# Patient Record
Sex: Male | Born: 1960 | Race: White | Hispanic: No | Marital: Married | Smoking: Former smoker
Health system: Southern US, Community
[De-identification: ages and names within clinical notes are randomized; demographics above are authoritative.]

## PROBLEM LIST (undated history)

## (undated) DIAGNOSIS — M5020 Other cervical disc displacement, unspecified cervical region: Secondary | ICD-10-CM

## (undated) DIAGNOSIS — F32A Depression, unspecified: Secondary | ICD-10-CM

## (undated) DIAGNOSIS — F329 Major depressive disorder, single episode, unspecified: Secondary | ICD-10-CM

## (undated) DIAGNOSIS — R413 Other amnesia: Secondary | ICD-10-CM

## (undated) DIAGNOSIS — H539 Unspecified visual disturbance: Secondary | ICD-10-CM

## (undated) DIAGNOSIS — C8190 Hodgkin lymphoma, unspecified, unspecified site: Secondary | ICD-10-CM

## (undated) DIAGNOSIS — F419 Anxiety disorder, unspecified: Secondary | ICD-10-CM

## (undated) HISTORY — PX: HERNIA REPAIR: SHX51

---

## 2015-05-11 DIAGNOSIS — C8100 Nodular lymphocyte predominant Hodgkin lymphoma, unspecified site: Secondary | ICD-10-CM | POA: Insufficient documentation

## 2015-07-16 ENCOUNTER — Ambulatory Visit: Payer: Self-pay | Admitting: Radiation Oncology

## 2015-07-20 ENCOUNTER — Encounter: Payer: Self-pay | Admitting: Radiation Oncology

## 2015-07-20 ENCOUNTER — Encounter (INDEPENDENT_AMBULATORY_CARE_PROVIDER_SITE_OTHER): Payer: Self-pay

## 2015-07-20 ENCOUNTER — Ambulatory Visit
Admission: RE | Admit: 2015-07-20 | Discharge: 2015-07-20 | Disposition: A | Discharge status: Home / Self Care | Payer: Medicare Other | Source: Ambulatory Visit | Attending: Radiation Oncology | Admitting: Radiation Oncology

## 2015-07-20 VITALS — BP 135/94 | HR 79 | Temp 96.9°F | Ht 66.0 in | Wt 230.6 lb

## 2015-07-20 DIAGNOSIS — Z510 Encounter for antineoplastic radiation therapy: Secondary | ICD-10-CM | POA: Insufficient documentation

## 2015-07-20 DIAGNOSIS — C8190 Hodgkin lymphoma, unspecified, unspecified site: Secondary | ICD-10-CM | POA: Insufficient documentation

## 2015-07-20 HISTORY — DX: Unspecified visual disturbance: H53.9

## 2015-07-20 HISTORY — DX: Other amnesia: R41.3

## 2015-07-20 HISTORY — DX: Major depressive disorder, single episode, unspecified: F32.9

## 2015-07-20 HISTORY — DX: Hodgkin lymphoma, unspecified, unspecified site: C81.90

## 2015-07-20 HISTORY — DX: Other cervical disc displacement, unspecified cervical region: M50.20

## 2015-07-20 HISTORY — DX: Anxiety disorder, unspecified: F41.9

## 2015-07-20 HISTORY — DX: Depression, unspecified: F32.A

## 2015-07-20 NOTE — Consult Note (Signed)
Except an outstanding is perfect of Radiation Oncology NEW PATIENT EVALUATION  Name: Alexander Humphrey  MRN: [id number]  Date:   07/20/2015     DOB: 1961/10/12   This 54 y.o. male patient presents to the clinic for initial evaluation of nodular lymphocyte predominant Hodgkin's lymphoma  REFERRING PHYSICIAN: No ref. provider found  CHIEF COMPLAINT:  Chief Complaint  Patient presents with  . Lymphoma    initial consult     DIAGNOSIS: The encounter diagnosis was Hodgkin lymphoma.   PREVIOUS INVESTIGATIONS:  PET CT scan and CT scans reviewed Surgical pathology report reviewed Clinical notes reviewed  HPI: Patient is a 54 year old male who was originally admitted in [place] [facility] [place] for presumed flu and pneumonia. He extensive peripheral interstitial infiltrates and incidentally noted were bulky left axillary lymph nodes the largest being 2.6 cm. Biopsy of his left axillary lymph node was seen and reviewed at the [facility] consistent with nodular lymphocyte predominant Hodgkin's lymphoma. PET CT scan demonstrated a hypermetabolic 1.3 cm left supraclavicular lymph node. Also noted were multiple small lymph nodes in the lower cervical chain on the left supraclavicular fossa and left axilla. Patient has no B type symptoms. His primary symptoms have improved. He is living here with his sister now in [place]. Recommendation at prior hospital was for radiation therapy involved field. Patient has declined bone marrow biopsy for complete staging. He has had a port placed in his right anterior chest wall.  PLANNED TREATMENT REGIMEN: Involved field radiation therapy  PAST MEDICAL HISTORY:  has a past medical history of Hodgkin's lymphoma; Memory changes; Changes in vision; Herniated disc, cervical; Anxiety; and Depression.    PAST SURGICAL HISTORY:  Past Surgical History  Procedure Laterality Date  . Hernia repair      FAMILY HISTORY: family history is not  on file.  SOCIAL HISTORY:  reports that he quit smoking about 34 years ago. He has never used smokeless tobacco. He reports that he does not drink alcohol or use illicit drugs.  ALLERGIES: Percocet  MEDICATIONS:  Current Outpatient Prescriptions  Medication Sig Dispense Refill  . carbamazepine (TEGRETOL) 200 MG tablet Take 400 mg by mouth 4 (four) times daily.    Marland Kitchen FLUoxetine (PROZAC) 40 MG capsule Take 40 mg by mouth 2 (two) times daily.     No current facility-administered medications for this encounter.    ECOG PERFORMANCE STATUS:  0 - Asymptomatic  REVIEW OF SYSTEMS:  Patient denies any weight loss, fatigue, weakness, fever, chills or night sweats. Patient denies any loss of vision, blurred vision. Patient denies any ringing  of the ears or hearing loss. No irregular heartbeat. Patient denies heart murmur or history of fainting. Patient denies any chest pain or pain radiating to her upper extremities. Patient denies any shortness of breath, difficulty breathing at night, cough or hemoptysis. Patient denies any swelling in the lower legs. Patient denies any nausea vomiting, vomiting of blood, or coffee ground material in the vomitus. Patient denies any stomach pain. Patient states has had normal bowel movements no significant constipation or diarrhea. Patient denies any dysuria, hematuria or significant nocturia. Patient denies any problems walking, swelling in the joints or loss of balance. Patient denies any skin changes, loss of hair or loss of weight. Patient denies any excessive worrying or anxiety or significant depression. Patient denies any problems with insomnia. Patient denies excessive thirst, polyuria, polydipsia. Patient denies any swollen glands, patient denies easy bruising or easy bleeding. Patient denies any recent  infections, allergies or URI. Patient "s visual fields have not changed significantly in recent time.    PHYSICAL EXAM: BP 135/94 mmHg  Pulse 79  Temp(Src) 96.9  F (36.1 C)  Ht 5' 6"  (1.676 m)  Wt 230 lb 9.6 oz (104.6 kg)  BMI 37.24 kg/m2 Patient is a poor Place his rent right anterior chest wall. No palpable adenopathy in his sub-digastric cervical supraclavicular or axillary regions are noted. Well-developed well-nourished patient in NAD. HEENT reveals PERLA, EOMI, discs not visualized.  Oral cavity is clear. No oral mucosal lesions are identified. Neck is clear without evidence of cervical or supraclavicular adenopathy. Lungs are clear to A&P. Cardiac examination is essentially unremarkable with regular rate and rhythm without murmur rub or thrill. Abdomen is benign with no organomegaly or masses noted. Motor sensory and DTR levels are equal and symmetric in the upper and lower extremities. Cranial nerves II through XII are grossly intact. Proprioception is intact. No peripheral adenopathy or edema is identified. No motor or sensory levels are noted. Crude visual fields are within normal range.   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 Based on his pulmonary interstitial infiltrate will try to avoid is much as long as possible and treat up to 3600 cGy to his neck left supraclavicular fossa and left axilla. I will present his case at our weekly tumor conference and I've also asked for consultation for medical oncology for their opinion on the case. Most data shows nodular lymphocyte predominant Hodgkin's lymphoma best treated with radiation therapy alone and reserving chemotherapy for salvage. Risks and benefits of treatment including skin reaction fatigue, some inclusion of normal lung alteration of blood counts and slight possibility of swelling of his left upper extremity all were discussed in detail  with the patient. He seems to comprehend my treatment plan well.  I would like to take this opportunity for allowing me to participate in the care of your patient.Armstead Peaks., MD

## 2015-07-23 ENCOUNTER — Inpatient Hospital Stay: Payer: Medicare Other

## 2015-07-23 ENCOUNTER — Ambulatory Visit: Payer: Medicare Other

## 2015-07-28 ENCOUNTER — Inpatient Hospital Stay: Payer: Medicare Other | Attending: Radiation Oncology

## 2015-07-28 ENCOUNTER — Ambulatory Visit
Admission: RE | Admit: 2015-07-28 | Discharge: 2015-07-28 | Disposition: A | Discharge status: Home / Self Care | Payer: Medicare Other | Source: Ambulatory Visit | Attending: Radiation Oncology | Admitting: Radiation Oncology

## 2015-07-28 DIAGNOSIS — C8190 Hodgkin lymphoma, unspecified, unspecified site: Secondary | ICD-10-CM | POA: Insufficient documentation

## 2015-07-28 DIAGNOSIS — Z452 Encounter for adjustment and management of vascular access device: Secondary | ICD-10-CM | POA: Insufficient documentation

## 2015-07-28 DIAGNOSIS — C801 Malignant (primary) neoplasm, unspecified: Secondary | ICD-10-CM

## 2015-07-28 DIAGNOSIS — Z510 Encounter for antineoplastic radiation therapy: Secondary | ICD-10-CM | POA: Diagnosis not present

## 2015-07-28 MED ORDER — SODIUM CHLORIDE 0.9 % IJ SOLN
10.0000 mL | INTRAMUSCULAR | Status: DC | PRN
  Administered 2015-07-28: 10 mL via INTRAVENOUS
  Filled 2015-07-28: qty 10

## 2015-07-28 MED ORDER — HEPARIN SOD (PORK) LOCK FLUSH 100 UNIT/ML IV SOLN
500.0000 [IU] | Freq: Once | INTRAVENOUS | Status: AC
  Administered 2015-07-28: 500 [IU] via INTRAVENOUS
  Filled 2015-07-28: qty 5

## 2015-07-30 DIAGNOSIS — Z510 Encounter for antineoplastic radiation therapy: Secondary | ICD-10-CM | POA: Diagnosis not present

## 2015-07-31 ENCOUNTER — Other Ambulatory Visit: Payer: Self-pay | Admitting: *Deleted

## 2015-07-31 DIAGNOSIS — C8100 Nodular lymphocyte predominant Hodgkin lymphoma, unspecified site: Secondary | ICD-10-CM

## 2015-08-04 ENCOUNTER — Ambulatory Visit
Admission: RE | Admit: 2015-08-04 | Discharge: 2015-08-04 | Disposition: A | Discharge status: Home / Self Care | Payer: Medicare Other | Source: Ambulatory Visit | Attending: Radiation Oncology | Admitting: Radiation Oncology

## 2015-08-04 DIAGNOSIS — Z510 Encounter for antineoplastic radiation therapy: Secondary | ICD-10-CM | POA: Diagnosis not present

## 2015-08-05 ENCOUNTER — Ambulatory Visit
Admission: RE | Admit: 2015-08-05 | Discharge: 2015-08-05 | Disposition: A | Discharge status: Home / Self Care | Payer: Medicare Other | Source: Ambulatory Visit | Attending: Radiation Oncology | Admitting: Radiation Oncology

## 2015-08-05 DIAGNOSIS — Z510 Encounter for antineoplastic radiation therapy: Secondary | ICD-10-CM | POA: Diagnosis not present

## 2015-08-06 ENCOUNTER — Ambulatory Visit
Admission: RE | Admit: 2015-08-06 | Discharge: 2015-08-06 | Disposition: A | Discharge status: Home / Self Care | Payer: Medicare Other | Source: Ambulatory Visit | Attending: Radiation Oncology | Admitting: Radiation Oncology

## 2015-08-06 ENCOUNTER — Ambulatory Visit: Payer: Medicare Other

## 2015-08-06 DIAGNOSIS — Z510 Encounter for antineoplastic radiation therapy: Secondary | ICD-10-CM | POA: Diagnosis not present

## 2015-08-07 ENCOUNTER — Ambulatory Visit
Admission: RE | Admit: 2015-08-07 | Discharge: 2015-08-07 | Disposition: A | Discharge status: Home / Self Care | Payer: Medicare Other | Source: Ambulatory Visit | Attending: Radiation Oncology | Admitting: Radiation Oncology

## 2015-08-07 DIAGNOSIS — Z510 Encounter for antineoplastic radiation therapy: Secondary | ICD-10-CM | POA: Diagnosis not present

## 2015-08-10 ENCOUNTER — Ambulatory Visit
Admission: RE | Admit: 2015-08-10 | Discharge: 2015-08-10 | Disposition: A | Discharge status: Home / Self Care | Payer: Medicare Other | Source: Ambulatory Visit | Attending: Radiation Oncology | Admitting: Radiation Oncology

## 2015-08-10 ENCOUNTER — Ambulatory Visit: Payer: Medicare Other

## 2015-08-10 DIAGNOSIS — Z510 Encounter for antineoplastic radiation therapy: Secondary | ICD-10-CM | POA: Diagnosis not present

## 2015-08-11 ENCOUNTER — Ambulatory Visit: Payer: Medicare Other

## 2015-08-11 ENCOUNTER — Ambulatory Visit
Admission: RE | Admit: 2015-08-11 | Discharge: 2015-08-11 | Disposition: A | Discharge status: Home / Self Care | Payer: Medicare Other | Source: Ambulatory Visit | Attending: Radiation Oncology | Admitting: Radiation Oncology

## 2015-08-11 DIAGNOSIS — Z510 Encounter for antineoplastic radiation therapy: Secondary | ICD-10-CM | POA: Diagnosis not present

## 2015-08-12 ENCOUNTER — Ambulatory Visit
Admission: RE | Admit: 2015-08-12 | Discharge: 2015-08-12 | Disposition: A | Discharge status: Home / Self Care | Payer: Medicare Other | Source: Ambulatory Visit | Attending: Radiation Oncology | Admitting: Radiation Oncology

## 2015-08-12 ENCOUNTER — Ambulatory Visit: Payer: Medicare Other

## 2015-08-12 ENCOUNTER — Inpatient Hospital Stay: Payer: Medicare Other

## 2015-08-12 DIAGNOSIS — C8190 Hodgkin lymphoma, unspecified, unspecified site: Secondary | ICD-10-CM | POA: Diagnosis not present

## 2015-08-12 DIAGNOSIS — Z510 Encounter for antineoplastic radiation therapy: Secondary | ICD-10-CM | POA: Diagnosis not present

## 2015-08-12 DIAGNOSIS — C8100 Nodular lymphocyte predominant Hodgkin lymphoma, unspecified site: Secondary | ICD-10-CM

## 2015-08-12 LAB — CBC
HCT: 41.7 % (ref 40.0–52.0)
HEMOGLOBIN: 14.2 g/dL (ref 13.0–18.0)
MCH: 30.5 pg (ref 26.0–34.0)
MCHC: 34 g/dL (ref 32.0–36.0)
MCV: 89.8 fL (ref 80.0–100.0)
Platelets: 247 10*3/uL (ref 150–440)
RBC: 4.64 MIL/uL (ref 4.40–5.90)
RDW: 13.3 % (ref 11.5–14.5)
WBC: 2.9 10*3/uL — ABNORMAL LOW (ref 3.8–10.6)

## 2015-08-13 ENCOUNTER — Ambulatory Visit
Admission: RE | Admit: 2015-08-13 | Discharge: 2015-08-13 | Disposition: A | Discharge status: Home / Self Care | Payer: Medicare Other | Source: Ambulatory Visit | Attending: Radiation Oncology | Admitting: Radiation Oncology

## 2015-08-13 ENCOUNTER — Ambulatory Visit: Payer: Medicare Other

## 2015-08-13 DIAGNOSIS — Z510 Encounter for antineoplastic radiation therapy: Secondary | ICD-10-CM | POA: Diagnosis not present

## 2015-08-14 ENCOUNTER — Ambulatory Visit
Admission: RE | Admit: 2015-08-14 | Discharge: 2015-08-14 | Disposition: A | Discharge status: Home / Self Care | Payer: Medicare Other | Source: Ambulatory Visit | Attending: Radiation Oncology | Admitting: Radiation Oncology

## 2015-08-14 ENCOUNTER — Ambulatory Visit: Payer: Medicare Other

## 2015-08-14 DIAGNOSIS — Z510 Encounter for antineoplastic radiation therapy: Secondary | ICD-10-CM | POA: Diagnosis not present

## 2015-08-17 ENCOUNTER — Ambulatory Visit
Admission: RE | Admit: 2015-08-17 | Discharge: 2015-08-17 | Disposition: A | Discharge status: Home / Self Care | Payer: Medicare Other | Source: Ambulatory Visit | Attending: Radiation Oncology | Admitting: Radiation Oncology

## 2015-08-17 ENCOUNTER — Ambulatory Visit: Payer: Medicare Other

## 2015-08-17 DIAGNOSIS — Z510 Encounter for antineoplastic radiation therapy: Secondary | ICD-10-CM | POA: Diagnosis not present

## 2015-08-18 ENCOUNTER — Ambulatory Visit
Admission: RE | Admit: 2015-08-18 | Discharge: 2015-08-18 | Disposition: A | Discharge status: Home / Self Care | Payer: Medicare Other | Source: Ambulatory Visit | Attending: Radiation Oncology | Admitting: Radiation Oncology

## 2015-08-18 ENCOUNTER — Ambulatory Visit: Payer: Medicare Other

## 2015-08-18 DIAGNOSIS — Z510 Encounter for antineoplastic radiation therapy: Secondary | ICD-10-CM | POA: Diagnosis not present

## 2015-08-19 ENCOUNTER — Inpatient Hospital Stay: Payer: Medicare Other

## 2015-08-19 ENCOUNTER — Ambulatory Visit
Admission: RE | Admit: 2015-08-19 | Discharge: 2015-08-19 | Disposition: A | Discharge status: Home / Self Care | Payer: Medicare Other | Source: Ambulatory Visit | Attending: Radiation Oncology | Admitting: Radiation Oncology

## 2015-08-19 ENCOUNTER — Ambulatory Visit: Payer: Medicare Other

## 2015-08-19 DIAGNOSIS — C8190 Hodgkin lymphoma, unspecified, unspecified site: Secondary | ICD-10-CM | POA: Diagnosis not present

## 2015-08-19 DIAGNOSIS — Z510 Encounter for antineoplastic radiation therapy: Secondary | ICD-10-CM | POA: Diagnosis not present

## 2015-08-19 DIAGNOSIS — C8100 Nodular lymphocyte predominant Hodgkin lymphoma, unspecified site: Secondary | ICD-10-CM

## 2015-08-19 LAB — CBC
HCT: 39 % — ABNORMAL LOW (ref 40.0–52.0)
Hemoglobin: 13.6 g/dL (ref 13.0–18.0)
MCH: 31.2 pg (ref 26.0–34.0)
MCHC: 34.8 g/dL (ref 32.0–36.0)
MCV: 89.7 fL (ref 80.0–100.0)
PLATELETS: 204 10*3/uL (ref 150–440)
RBC: 4.35 MIL/uL — ABNORMAL LOW (ref 4.40–5.90)
RDW: 13 % (ref 11.5–14.5)
WBC: 2.9 10*3/uL — ABNORMAL LOW (ref 3.8–10.6)

## 2015-08-20 ENCOUNTER — Ambulatory Visit: Payer: Medicare Other

## 2015-08-20 ENCOUNTER — Ambulatory Visit
Admission: RE | Admit: 2015-08-20 | Discharge: 2015-08-20 | Disposition: A | Discharge status: Home / Self Care | Payer: Medicare Other | Source: Ambulatory Visit | Attending: Radiation Oncology | Admitting: Radiation Oncology

## 2015-08-20 DIAGNOSIS — Z510 Encounter for antineoplastic radiation therapy: Secondary | ICD-10-CM | POA: Diagnosis not present

## 2015-08-21 ENCOUNTER — Ambulatory Visit
Admission: RE | Admit: 2015-08-21 | Discharge: 2015-08-21 | Disposition: A | Discharge status: Home / Self Care | Payer: Medicare Other | Source: Ambulatory Visit | Attending: Radiation Oncology | Admitting: Radiation Oncology

## 2015-08-21 ENCOUNTER — Ambulatory Visit: Payer: Medicare Other

## 2015-08-21 DIAGNOSIS — Z510 Encounter for antineoplastic radiation therapy: Secondary | ICD-10-CM | POA: Diagnosis not present

## 2015-08-23 ENCOUNTER — Ambulatory Visit
Admission: RE | Admit: 2015-08-23 | Discharge: 2015-08-23 | Disposition: A | Discharge status: Home / Self Care | Payer: Medicare Other | Source: Ambulatory Visit | Attending: Radiation Oncology | Admitting: Radiation Oncology

## 2015-08-24 ENCOUNTER — Ambulatory Visit
Admission: RE | Admit: 2015-08-24 | Discharge: 2015-08-24 | Disposition: A | Discharge status: Home / Self Care | Payer: Medicare Other | Source: Ambulatory Visit | Attending: Radiation Oncology | Admitting: Radiation Oncology

## 2015-08-24 ENCOUNTER — Ambulatory Visit: Payer: Medicare Other

## 2015-08-24 DIAGNOSIS — Z510 Encounter for antineoplastic radiation therapy: Secondary | ICD-10-CM | POA: Diagnosis not present

## 2015-08-25 ENCOUNTER — Ambulatory Visit
Admission: RE | Admit: 2015-08-25 | Discharge: 2015-08-25 | Disposition: A | Discharge status: Home / Self Care | Payer: Medicare Other | Source: Ambulatory Visit | Attending: Radiation Oncology | Admitting: Radiation Oncology

## 2015-08-25 ENCOUNTER — Ambulatory Visit: Payer: Medicare Other

## 2015-08-25 DIAGNOSIS — Z510 Encounter for antineoplastic radiation therapy: Secondary | ICD-10-CM | POA: Diagnosis not present

## 2015-08-26 ENCOUNTER — Inpatient Hospital Stay: Payer: Medicare Other | Attending: Radiation Oncology

## 2015-08-26 ENCOUNTER — Ambulatory Visit
Admission: RE | Admit: 2015-08-26 | Discharge: 2015-08-26 | Disposition: A | Discharge status: Home / Self Care | Payer: Medicare Other | Source: Ambulatory Visit | Attending: Radiation Oncology | Admitting: Radiation Oncology

## 2015-08-26 ENCOUNTER — Ambulatory Visit: Payer: Medicare Other

## 2015-08-26 DIAGNOSIS — R413 Other amnesia: Secondary | ICD-10-CM | POA: Insufficient documentation

## 2015-08-26 DIAGNOSIS — F419 Anxiety disorder, unspecified: Secondary | ICD-10-CM | POA: Insufficient documentation

## 2015-08-26 DIAGNOSIS — Z87891 Personal history of nicotine dependence: Secondary | ICD-10-CM | POA: Insufficient documentation

## 2015-08-26 DIAGNOSIS — F329 Major depressive disorder, single episode, unspecified: Secondary | ICD-10-CM | POA: Insufficient documentation

## 2015-08-26 DIAGNOSIS — Z923 Personal history of irradiation: Secondary | ICD-10-CM | POA: Insufficient documentation

## 2015-08-26 DIAGNOSIS — M5020 Other cervical disc displacement, unspecified cervical region: Secondary | ICD-10-CM | POA: Diagnosis not present

## 2015-08-26 DIAGNOSIS — C8100 Nodular lymphocyte predominant Hodgkin lymphoma, unspecified site: Secondary | ICD-10-CM

## 2015-08-26 DIAGNOSIS — Z452 Encounter for adjustment and management of vascular access device: Secondary | ICD-10-CM | POA: Insufficient documentation

## 2015-08-26 DIAGNOSIS — C8190 Hodgkin lymphoma, unspecified, unspecified site: Secondary | ICD-10-CM | POA: Insufficient documentation

## 2015-08-26 DIAGNOSIS — Z510 Encounter for antineoplastic radiation therapy: Secondary | ICD-10-CM | POA: Diagnosis not present

## 2015-08-26 LAB — CBC
HEMATOCRIT: 39 % — AB (ref 40.0–52.0)
Hemoglobin: 13.3 g/dL (ref 13.0–18.0)
MCH: 30.7 pg (ref 26.0–34.0)
MCHC: 34.2 g/dL (ref 32.0–36.0)
MCV: 89.8 fL (ref 80.0–100.0)
PLATELETS: 193 10*3/uL (ref 150–440)
RBC: 4.35 MIL/uL — ABNORMAL LOW (ref 4.40–5.90)
RDW: 13.3 % (ref 11.5–14.5)
WBC: 4.2 10*3/uL (ref 3.8–10.6)

## 2015-08-27 ENCOUNTER — Ambulatory Visit
Admission: RE | Admit: 2015-08-27 | Discharge: 2015-08-27 | Disposition: A | Discharge status: Home / Self Care | Payer: Medicare Other | Source: Ambulatory Visit | Attending: Radiation Oncology | Admitting: Radiation Oncology

## 2015-08-27 ENCOUNTER — Ambulatory Visit: Admission: RE | Admit: 2015-08-27 | Payer: Medicare Other | Source: Ambulatory Visit

## 2015-08-27 ENCOUNTER — Ambulatory Visit: Payer: Medicare Other

## 2015-08-27 DIAGNOSIS — Z510 Encounter for antineoplastic radiation therapy: Secondary | ICD-10-CM | POA: Diagnosis not present

## 2015-08-28 ENCOUNTER — Ambulatory Visit
Admission: RE | Admit: 2015-08-28 | Discharge: 2015-08-28 | Disposition: A | Discharge status: Home / Self Care | Payer: Medicare Other | Source: Ambulatory Visit | Attending: Radiation Oncology | Admitting: Radiation Oncology

## 2015-08-28 ENCOUNTER — Ambulatory Visit: Payer: Medicare Other

## 2015-08-28 DIAGNOSIS — Z510 Encounter for antineoplastic radiation therapy: Secondary | ICD-10-CM | POA: Diagnosis not present

## 2015-08-31 ENCOUNTER — Ambulatory Visit
Admission: RE | Admit: 2015-08-31 | Discharge: 2015-08-31 | Disposition: A | Discharge status: Home / Self Care | Payer: Medicare Other | Source: Ambulatory Visit | Attending: Radiation Oncology | Admitting: Radiation Oncology

## 2015-08-31 DIAGNOSIS — Z510 Encounter for antineoplastic radiation therapy: Secondary | ICD-10-CM | POA: Diagnosis not present

## 2015-09-01 ENCOUNTER — Other Ambulatory Visit: Payer: Self-pay | Admitting: *Deleted

## 2015-09-01 ENCOUNTER — Ambulatory Visit
Admission: RE | Admit: 2015-09-01 | Discharge: 2015-09-01 | Disposition: A | Discharge status: Home / Self Care | Payer: Medicare Other | Source: Ambulatory Visit | Attending: Radiation Oncology | Admitting: Radiation Oncology

## 2015-09-01 DIAGNOSIS — Z510 Encounter for antineoplastic radiation therapy: Secondary | ICD-10-CM | POA: Diagnosis not present

## 2015-09-01 MED ORDER — SUCRALFATE 1 G PO TABS: 1.0000 g | ORAL_TABLET | Freq: Three times a day (TID) | ORAL | Status: DC

## 2015-09-04 ENCOUNTER — Other Ambulatory Visit: Payer: Self-pay

## 2015-09-04 ENCOUNTER — Inpatient Hospital Stay (HOSPITAL_BASED_OUTPATIENT_CLINIC_OR_DEPARTMENT_OTHER): Payer: Medicare Other | Admitting: Hematology and Oncology

## 2015-09-04 ENCOUNTER — Inpatient Hospital Stay: Payer: Medicare Other

## 2015-09-04 VITALS — BP 142/83 | HR 92 | Temp 97.1°F | Wt 224.3 lb

## 2015-09-04 DIAGNOSIS — C8190 Hodgkin lymphoma, unspecified, unspecified site: Secondary | ICD-10-CM | POA: Diagnosis not present

## 2015-09-04 DIAGNOSIS — F329 Major depressive disorder, single episode, unspecified: Secondary | ICD-10-CM

## 2015-09-04 DIAGNOSIS — M5020 Other cervical disc displacement, unspecified cervical region: Secondary | ICD-10-CM

## 2015-09-04 DIAGNOSIS — Z452 Encounter for adjustment and management of vascular access device: Secondary | ICD-10-CM

## 2015-09-04 DIAGNOSIS — R413 Other amnesia: Secondary | ICD-10-CM

## 2015-09-04 DIAGNOSIS — C801 Malignant (primary) neoplasm, unspecified: Secondary | ICD-10-CM

## 2015-09-04 DIAGNOSIS — Z923 Personal history of irradiation: Secondary | ICD-10-CM

## 2015-09-04 DIAGNOSIS — F419 Anxiety disorder, unspecified: Secondary | ICD-10-CM

## 2015-09-04 DIAGNOSIS — C8110 Nodular sclerosis classical Hodgkin lymphoma, unspecified site: Secondary | ICD-10-CM

## 2015-09-04 DIAGNOSIS — C8100 Nodular lymphocyte predominant Hodgkin lymphoma, unspecified site: Secondary | ICD-10-CM

## 2015-09-04 DIAGNOSIS — Z87891 Personal history of nicotine dependence: Secondary | ICD-10-CM

## 2015-09-04 MED ORDER — SODIUM CHLORIDE 0.9 % IJ SOLN
10.0000 mL | Freq: Once | INTRAMUSCULAR | Status: AC
  Administered 2015-09-04: 10 mL via INTRAVENOUS
  Filled 2015-09-04: qty 10

## 2015-09-04 MED ORDER — HEPARIN SOD (PORK) LOCK FLUSH 100 UNIT/ML IV SOLN
500.0000 [IU] | Freq: Once | INTRAVENOUS | Status: AC
  Administered 2015-09-04: 500 [IU] via INTRAVENOUS
  Filled 2015-09-04: qty 5

## 2015-09-04 NOTE — Progress Notes (Signed)
[place] [facility] day:  09/04/2015  Chief Complaint: Alexander Humphrey is a 54 y.o. male with stage IIA nodular lymphocyte Hodgkin's disease status post involved field radiation who is seen for initial assessment.  HPI: Patient presented with flu-like symptoms and pneumonia to [facility] in [place], [place]. Chest CT on 02/27/2015 revealed extensive bilateral peripheral interstitial infiltrates as well as bulky left axillary adenopathy (largest 2.6 cm).  Follow-up chest CT on 04/15/2015 revealed stable left axillary adenopathy and persistence of the pulmonary infiltrates.  Biopsy of a left axillary node on 05/11/2015 (reviewed by the [facility]) was consistent with nodular lymphocyte predominant Hodgkin's disease with focal increased number of LP Reed-Sternberg cells.  The patient declined bone marrow aspirate and biopsy.  CBC with differential was normal.  ESR was 4.  LDH was 211 (normal).  He declined bronchoscopy.  Echo on 06/07/2015 revealed an EF of 55-60%.  A port was placed initially in anticipation of chemotherapy.  He denied any B symptoms.  After staging was complete, outside recommendations were for involved field radiation.  PET scan on 06/08/2015 revealed multiple small lymph nodes in the lower neck, supraclavicular area and bilateral axillary lymph nodes. Many lymph nodes did not have corresponding FDG uptake.  There was a 1 cm left lower neck lymph node (SUV 3.3) and a 1.3 cm left supraclavicular lymph node (SUV 5.5). There were multiple enlarged left axillary nodes. There was a 7.3 cm collection without FDG uptake, likely iatrogenic.   There was a focal area of FDG uptake at the posterior border of a 2.3 cm lymph node (SUV 9.6).  There was no abnormal FDG uptake in the mediastinum or lungs, abdomen or pelvis.  The patient moved to [facility] to be with his sister. He was initially seen by Dr. Noreene Filbert on  07/20/2015 for consideration of radiation. He received 30.6 Gy from 08/15/2015 until 08/27/2015 with a supraclavicular boost of 1.8 Gy from 08/28/2015 until 09/01/2015.  He tolerated his treatments well except for some dysphagia from radiation esophagitis and mild erythematous changes in the left neck.  Symptomatically, he is feeling a lot better.  He denies any B symptoms.  He denies any adenopathy.  Past Medical History  Diagnosis Date  . Hodgkin's lymphoma   . Memory changes   . Changes in vision     due to eye injury  . Herniated disc, cervical   . Anxiety   . Depression     Past Surgical History  Procedure Laterality Date  . Hernia repair      No family history on file.  Social History:  reports that he quit smoking about 34 years ago. He has never used smokeless tobacco. He reports that he does not drink alcohol or use illicit drugs.  He previously lived in [place].  The patient is accompanied by his sister, Judeen Hammans, today.  Allergies:  Allergies  Allergen Reactions  . Percocet [Oxycodone-Acetaminophen] Shortness Of Breath    Current Medications: Current Outpatient Prescriptions  Medication Sig Dispense Refill  . carbamazepine (TEGRETOL) 200 MG tablet Take 400 mg by mouth 4 (four) times daily.    Marland Kitchen FLUoxetine (PROZAC) 40 MG capsule Take 40 mg by mouth 2 (two) times daily.    . sucralfate (CARAFATE) 1 G tablet Take 1 tablet (1 g total) by mouth 4 (four) times daily -  with meals and at bedtime. 90 tablet 3   No current facility-administered medications for this visit.  Review of Systems:  GENERAL:  Feels "a lot better".  Active.  No fevers, sweats or weight loss. PERFORMANCE STATUS (ECOG):  0 HEENT:  Sore throat from radiation.  Voice lost, now improving.  Old nail trauma to left eye.  No visual changes, runny nose, mouth sores or tenderness. Lungs: No shortness of breath or cough.  No hemoptysis. Cardiac:  No chest pain, palpitations, orthopnea, or PND. GI:   No nausea, vomiting, diarrhea, constipation, melena or hematochezia. GU:  No urgency, frequency, dysuria, or hematuria. Musculoskeletal:  Chronic back problems.  No joint pain.  No muscle tenderness. Extremities:  No pain or swelling. Skin:  No rashes or skin changes. Neuro:  No headache, numbness or weakness, balance or coordination issues. Endocrine:  No diabetes, thyroid issues, hot flashes or night sweats. Psych:  No mood changes, depression or anxiety. Pain:  No focal pain. Review of systems:  All other systems reviewed and found to be negative.  Physical Exam: Blood pressure 142/83, pulse 92, temperature 97.1 F (36.2 C), temperature source Tympanic, weight 224 lb 5.1 oz (101.75 kg). GENERAL:  Well developed, well nourished, sitting comfortably in the exam room in no acute distress. MENTAL STATUS:  Alert and oriented to person, place and time. HEAD:  Lilyan Punt with goatee.  Normocephalic, atraumatic, face symmetric, no Cushingoid features. EYES:  Blue eyes.  Left eye dilated and deviated s/p old trauma.  Right pupil reactive to light.  No conjunctivitis or scleral icterus. ENT:  Oropharynx clear without lesion.  Tongue normal. Mucous membranes moist.  RESPIRATORY:  Clear to auscultation without rales, wheezes or rhonchi. CARDIOVASCULAR:  Regular rate and rhythm without murmur, rub or gallop. ABDOMEN:  Soft, non-tender, with active bowel sounds, and no hepatosplenomegaly.  No masses. SKIN:  Tan.  No rashes, ulcers or lesions. EXTREMITIES: No edema, no skin discoloration or tenderness.  No palpable cords. LYMPH NODES: No palpable cervical, supraclavicular, axillary or inguinal adenopathy  NEUROLOGICAL: Unremarkable. PSYCH:  Appropriate.  No visits with results within 3 Day(s) from this visit. Latest known visit with results is:  Appointment on 08/26/2015  Component Date Value Ref Range Status  . WBC 08/26/2015 4.2  3.8 - 10.6 K/uL Final  . RBC 08/26/2015 4.35* 4.40 - 5.90 MIL/uL  Final  . Hemoglobin 08/26/2015 13.3  13.0 - 18.0 g/dL Final  . HCT 08/26/2015 39.0* 40.0 - 52.0 % Final  . MCV 08/26/2015 89.8  80.0 - 100.0 fL Final  . MCH 08/26/2015 30.7  26.0 - 34.0 pg Final  . MCHC 08/26/2015 34.2  32.0 - 36.0 g/dL Final  . RDW 08/26/2015 13.3  11.5 - 14.5 % Final  . Platelets 08/26/2015 193  150 - 440 K/uL Final    Assessment:  Edrik Teaster is a 54 y.o. male with a history of stage IIA nodular lymphocyte Hodgkin's disease presnting with flu-like symptoms and pneumonia while living in [place]. Left axillary node biopsy on 05/11/2015 was consistent with nodular lymphocyte predominant Hodgkin's disease with focal increased number of LP Reed-Sternberg cells (reviewed by the [facility]).  He declined bone marrow aspirate and biopsy.   PET scan on 06/08/2015 revealed multiple small lymph nodes in the lower neck, supraclavicular area, and bilateral axillary lymph nodes. Many lymph nodes did not have corresponding FDG uptake.  There was a 1 cm left lower neck lymph node (SUV 3.3) and a 1.3 cm left supraclavicular lymph node (SUV 5.5). There were multiple enlarged left axillary nodes. There was a 7.3 cm collection without FDG  uptake, likely iatrogenic.   There was a focal area of FDG uptake at the posterior border of a 2.3 cm lymph node (SUV 9.6).  There was no abnormal FDG uptake in the mediastinum or lungs, abdomen or pelvis.  He received involved field radiation at [facility] from 08/15/2015 until 09/01/2015.  Symptomatically, he denies any B symptoms.  Exam reveals no palpable adenopathy.  Plan: 1. Discuss medical history, diagnosis and management of nodular lymphocyte Hodgkin';s disease. 2. Discuss scheduled follow-up (every 3-6 months 1-2 years, every 6-12 months until year 3, then annually).  Discuss follow-up imaging studies.  Discuss initial PET scan 3 months following completion of radiation. 3. Review outside records from [place].  Obtain radiation treatment  summary from Dr. Baruch Gouty. 4. Schedule PET scan in 3 months. 5. Labs day of PET scan:  CBC with diff, CMP, CMP, LDH, uric acid, ESR, ferritin. 6. Port flush every 6-8 weeks. 7. RTC after PET scan.   Lequita Asal, MD  09/04/2015, 2:02 PM

## 2015-09-30 ENCOUNTER — Ambulatory Visit
Admission: RE | Admit: 2015-09-30 | Discharge: 2015-09-30 | Disposition: A | Discharge status: Home / Self Care | Payer: Medicare Other | Source: Ambulatory Visit | Attending: Radiation Oncology | Admitting: Radiation Oncology

## 2015-09-30 ENCOUNTER — Encounter: Payer: Self-pay | Admitting: Radiation Oncology

## 2015-09-30 ENCOUNTER — Other Ambulatory Visit: Payer: Self-pay | Admitting: *Deleted

## 2015-09-30 VITALS — BP 136/90 | HR 69 | Temp 95.7°F | Resp 20 | Wt 215.8 lb

## 2015-09-30 DIAGNOSIS — C8190 Hodgkin lymphoma, unspecified, unspecified site: Secondary | ICD-10-CM

## 2015-09-30 MED ORDER — LANSOPRAZOLE 30 MG PO CPDR: 30.0000 mg | DELAYED_RELEASE_CAPSULE | Freq: Every day | ORAL | Status: DC

## 2015-09-30 MED ORDER — AZITHROMYCIN 250 MG PO TABS: ORAL_TABLET | ORAL | Status: DC

## 2015-09-30 MED ORDER — DEXAMETHASONE 4 MG PO TABS: 4.0000 mg | ORAL_TABLET | Freq: Every day | ORAL | Status: DC

## 2015-09-30 NOTE — Progress Notes (Signed)
Radiation Oncology Follow up Note  Name: Alexander Humphrey   Date:   09/30/2015 MRN:  [id number] DOB: [birth year]    This 54 y.o. male presents to the clinic today for Hodgkin's lymphoma.  REFERRING PROVIDER: No ref. provider found  HPI: Patient is a 54 year old male now out several weeks having completed radiation therapy to supraclavicular and left axillary region for lymphocyte predominant Hodgkin's lymphoma. Patient is an obese type symptoms. He has a history of recurrent pneumonia and is seen today at his own request complaining of night sweats increased dyspnea on exertion. He has a nonproductive cough. He also states she's lost about 20 pounds in weight..  COMPLICATIONS OF TREATMENT: present  FOLLOW UP COMPLIANCE: keeps appointments   PHYSICAL EXAM:  BP 136/90 mmHg  Pulse 69  Temp(Src) 95.7 F (35.4 C)  Resp 20  Wt 215 lb 13.3 oz (97.9 kg) No evidence of cervical supraclavicular or axillary adenopathy is identified. Lungs are clear to A&P. Well-developed well-nourished patient in NAD. HEENT reveals PERLA, EOMI, discs not visualized.  Oral cavity is clear. No oral mucosal lesions are identified. Neck is clear without evidence of cervical or supraclavicular adenopathy. Lungs are clear to A&P. Cardiac examination is essentially unremarkable with regular rate and rhythm without murmur rub or thrill. Abdomen is benign with no organomegaly or masses noted. Motor sensory and DTR levels are equal and symmetric in the upper and lower extremities. Cranial nerves II through XII are grossly intact. Proprioception is intact. No peripheral adenopathy or edema is identified. No motor or sensory levels are noted. Crude visual fields are within normal range.  RADIOLOGY RESULTS: CT scan of the chest with contrast has been ordered  PLAN: At the present time I'm starting him on Decadron 4 mg once a day. I'll also start him on a Z-Pak for possible infection. I have ordered a CT scan with contrast of his  chest and am referring him to pulmonology for evaluation. Also asking he sees the medical oncologists over the next several weeks. I have asked to see him back in 3 weeks after his CT scan for evaluation. Further recommendations will be made as consultations and CT scans are reviewed.  I would like to take this opportunity for allowing me to participate in the care of your patient.Armstead Peaks., MD

## 2015-10-01 ENCOUNTER — Encounter: Payer: Self-pay | Admitting: Pulmonary Disease

## 2015-10-01 ENCOUNTER — Ambulatory Visit (INDEPENDENT_AMBULATORY_CARE_PROVIDER_SITE_OTHER): Payer: Medicare Other | Admitting: Pulmonary Disease

## 2015-10-01 VITALS — BP 126/62 | HR 67 | Ht 67.0 in | Wt 217.4 lb

## 2015-10-01 DIAGNOSIS — R071 Chest pain on breathing: Secondary | ICD-10-CM

## 2015-10-01 DIAGNOSIS — R0600 Dyspnea, unspecified: Secondary | ICD-10-CM | POA: Diagnosis not present

## 2015-10-01 DIAGNOSIS — J700 Acute pulmonary manifestations due to radiation: Secondary | ICD-10-CM | POA: Diagnosis not present

## 2015-10-01 NOTE — Progress Notes (Signed)
PULMONARY CONSULT NOTE  Requesting MD/Service: Berton Mount, MD/Rad Onc Date of initial consult: 10/01/15 Reason for consultation: dyspnea, chest pain   HPI:  32 M who is a somewhat difficult historian. He was diagnosed with Hodgkin's lymphoma in [place] approx one year ago after he was admitted with PNA and noted to have L axillary adenopathy. The diagnosis was made by axillary node biopsy. He came to this community because he has family here and he underwent XRT with reported remission/cure. However, he was seen by Dr Donella Stade on the day prior to this encounter and reported increased dyspnea and L sided chest discomfort with inspiration. He believes his current symptoms are similar to those that he experienced when he was initially diagnosed. These symptoms have been present for 4-5 days. He has no fever presently but experienced subjective fever transiently approx 5 days ago. Dr Donella Stade ordered azithromycin, dexamethasone and a CT of chest. He has not filled these prescriptions yet. He denies wt loss, sinus symptoms, neck pain, cough, sputum production, hemoptysis, exertional CP, abd pain, N/V/D, dysuria. He has had no unexplained wt loss and no documented fevers.  Past Medical History  Diagnosis Date  . Hodgkin's lymphoma ([place])   . Memory changes   . Changes in vision     due to eye injury  . Herniated disc, cervical   . Anxiety   . Depression   Bipolar D/O  Past Surgical History  Procedure Laterality Date  . Hernia repair      MEDICATIONS: reviewed  Social History   Social History  . Marital Status: Married    Spouse Name: N/A  . Number of Children: N/A  . Years of Education: N/A   Occupational History  . Not on file.   Social History Main Topics  . Smoking status: Former Smoker    Quit date: 11/18/1980  . Smokeless tobacco: Never Used  . Alcohol Use: No  . Drug Use: No  . Sexual Activity: Not on file   Other Topics Concern  . Not on file   Social History  Narrative    Family History  Problem Relation Age of Onset  . Family history unknown: Yes    ROS - as per HPI  Filed Vitals:   10/01/15 1003  BP: 126/62  Pulse: 67  Height: 5\' 7"  (1.702 m)  Weight: 217 lb 6.4 oz (98.612 kg)  SpO2: 96%    EXAM:   Gen: WDWN in NAD HEENT: All WNL Neck: NO LAN, no JVD noted Lungs: slightly coarse BS in upper L lung zone, normal percussion note throughout, no adventitious sounds Cardiovascular: Reg rate, normal rhythm, no M noted Abdomen: Soft, NT +BS Ext: no C/C/E Neuro: CNs intact, motor/sens grossly intact Skin: radiation dermatitis upper L chest anteriorly and posteriorly    DATA:  No flowsheet data found.  CBC Latest Ref Rng 08/26/2015 08/19/2015 08/12/2015  WBC 3.8 - 10.6 K/uL 4.2 2.9(L) 2.9(L)  Hemoglobin 13.0 - 18.0 g/dL 13.3 13.6 14.2  Hematocrit 40.0 - 52.0 % 39.0(L) 39.0(L) 41.7  Platelets 150 - 440 K/uL 193 204 247    No CXR available   IMPRESSION:   Acute dyspnea and vague chest discomfort - doubt infectious PNA. Possible developing radiation pneumonitis.   PLAN:  I have encouraged that he fill the prescriptions for azithromyzin and dexamethasone and take as prescribed by Dr Donella Stade I explained that it is impossible for me to render a complete assessment without any kind of chest imaging. A CT chest is  ordered for next week. We will have him follow up after that and consider further evaluation and therapy   Merton Border, MD PCCM service Mobile [phone] Pager [phone]

## 2015-10-05 ENCOUNTER — Ambulatory Visit: Payer: Medicare Other | Admitting: Radiation Oncology

## 2015-10-08 ENCOUNTER — Ambulatory Visit
Admission: RE | Admit: 2015-10-08 | Discharge: 2015-10-08 | Disposition: A | Discharge status: Home / Self Care | Payer: Medicare Other | Source: Ambulatory Visit | Attending: Radiation Oncology | Admitting: Radiation Oncology

## 2015-10-08 DIAGNOSIS — C8190 Hodgkin lymphoma, unspecified, unspecified site: Secondary | ICD-10-CM | POA: Diagnosis present

## 2015-10-08 DIAGNOSIS — R918 Other nonspecific abnormal finding of lung field: Secondary | ICD-10-CM | POA: Insufficient documentation

## 2015-10-08 DIAGNOSIS — R079 Chest pain, unspecified: Secondary | ICD-10-CM | POA: Insufficient documentation

## 2015-10-08 DIAGNOSIS — Z923 Personal history of irradiation: Secondary | ICD-10-CM | POA: Insufficient documentation

## 2015-10-08 MED ORDER — IOHEXOL 300 MG/ML  SOLN
75.0000 mL | Freq: Once | INTRAMUSCULAR | Status: AC | PRN
  Administered 2015-10-08: 75 mL via INTRAVENOUS

## 2015-10-09 ENCOUNTER — Ambulatory Visit (INDEPENDENT_AMBULATORY_CARE_PROVIDER_SITE_OTHER): Payer: Medicare Other | Admitting: Pulmonary Disease

## 2015-10-09 ENCOUNTER — Other Ambulatory Visit
Admission: RE | Admit: 2015-10-09 | Discharge: 2015-10-09 | Disposition: A | Discharge status: Home / Self Care | Payer: Medicare Other | Source: Ambulatory Visit | Attending: Pulmonary Disease | Admitting: Pulmonary Disease

## 2015-10-09 ENCOUNTER — Encounter: Payer: Self-pay | Admitting: Pulmonary Disease

## 2015-10-09 VITALS — BP 130/76 | HR 66 | Ht 67.0 in | Wt 217.8 lb

## 2015-10-09 DIAGNOSIS — J438 Other emphysema: Secondary | ICD-10-CM

## 2015-10-09 DIAGNOSIS — C8190 Hodgkin lymphoma, unspecified, unspecified site: Secondary | ICD-10-CM | POA: Diagnosis not present

## 2015-10-09 DIAGNOSIS — R918 Other nonspecific abnormal finding of lung field: Secondary | ICD-10-CM | POA: Diagnosis present

## 2015-10-11 NOTE — Progress Notes (Signed)
PULMONARY OFFICE FOLLOW-UP NOTE  Requesting MD/Service: Berton Mount, MD/Rad Onc Date of initial consult: 10/01/15 Reason for consultation: dyspnea, chest pain   INITIAL HPI:  45 M who is a somewhat difficult historian. He was diagnosed with Hodgkin's lymphoma in [place] approx one year ago after he was admitted with PNA and noted to have L axillary adenopathy. The diagnosis was made by axillary node biopsy. He came to this community because he has family here and he underwent XRT with reported remission/cure. However, he was seen by Dr Donella Stade on the day prior to this encounter and reported increased dyspnea and L sided chest discomfort with inspiration. He believes his current symptoms are similar to those that he experienced when he was initially diagnosed. These symptoms have been present for 4-5 days. He has no fever presently but experienced subjective fever transiently approx 5 days ago. Dr Donella Stade ordered azithromycin, dexamethasone and a CT of chest. He has not filled these prescriptions yet. He denies wt loss, sinus symptoms, neck pain, cough, sputum production, hemoptysis, exertional CP, abd pain, N/V/D, dysuria. He has had no unexplained wt loss and no documented fevers.  INITIAL ASSESSMENT/PLAN: Acute dyspnea and vague chest discomfort - doubt infectious PNA. Possible developing radiation pneumonitis.  H/O Hodgkin's Lymphoma - s/p XRT  PLAN:  I have encouraged that he fill the prescriptions for azithromyzin and dexamethasone and take as prescribed by Dr Donella Stade I explained that it is impossible for me to render a complete assessment without any kind of chest imaging. A CT chest is ordered for next week. We will have him follow up after that and consider further evaluation and therapy  SUBJ: Scheduled F/U to review CT chest and response to azithromycin and dexamethasone initiated 10/01/15. Overall, he feels somewhat better. Night sweats have resolved. He continues to have mild DOE  and vague chest discomfort. Denies cough, hemoptysis, LE edema, calf tenderness. No unexplained weight loss.   OBJ:  Filed Vitals:   10/09/15 1336  BP: 130/76  Pulse: 66  Height: 5\' 7"  (1.702 m)  Weight: 217 lb 12.8 oz (98.793 kg)  SpO2: 96%    EXAM:  Gen: WDWN in NAD HEENT: All WNL Neck: NO LAN, no JVD noted Lungs: slightly coarse BS in upper L lung zone, normal percussion note throughout, no adventitious sounds Cardiovascular: Reg rate, normal rhythm, no M noted Abdomen: Soft, NT +BS Ext: no C/C/E Neuro: CNs intact, motor/sens grossly intact Skin: radiation dermatitis upper L chest anteriorly and posteriorly    DATA:   CBC Latest Ref Rng 08/26/2015 08/19/2015 08/12/2015  WBC 3.8 - 10.6 K/uL 4.2 2.9(L) 2.9(L)  Hemoglobin 13.0 - 18.0 g/dL 13.3 13.6 14.2  Hematocrit 40.0 - 52.0 % 39.0(L) 39.0(L) 41.7  Platelets 150 - 440 K/uL 193 204 247    CT chest 10/08/15: BUL L>R AS dz. Suspect emphysematous changes in lower lobes  IMPRESSION/PLAN:   1) Bilateral L>R upper lobe infiltrates. CT appearance suggests an inflammatory process, infectious vs noninfectious. Would include radiation pneumonitis in differential but appearance and distribution makes this less likely. H/O HL raises concern for opportunistic infections including mycobacterial infections. His symptoms, in particular night sweats, seem to be improving.   - Plan: Quantiferon tb gold assay  - Cont dexamethasone at current dose (4 mg daily).   - ROV 3-4 weeks with CXR. If infiltrates and/or symptoms persist, will likely pursue FOB for BAL and TBBx  2) Hodgkin lymphoma - thought to be in remission  3) Emphysema - although it is  not mentioned in the official reading, I believe the CT chest shows emphysema. He has minimal smoking history.   - Check A1AT levels   At his request, I spoke with his sister to explain the above plan. I have emphasized that he needs to call sooner in the interim if his symptoms are  worsening.  Merton Border, MD PCCM service Mobile [phone] Pager [phone]

## 2015-10-13 ENCOUNTER — Inpatient Hospital Stay: Payer: Medicare Other | Attending: Hematology and Oncology

## 2015-10-13 ENCOUNTER — Encounter: Payer: Self-pay | Admitting: Hematology and Oncology

## 2015-10-13 ENCOUNTER — Inpatient Hospital Stay (HOSPITAL_BASED_OUTPATIENT_CLINIC_OR_DEPARTMENT_OTHER): Payer: Medicare Other | Admitting: Hematology and Oncology

## 2015-10-13 VITALS — BP 144/97 | HR 94 | Temp 96.7°F | Resp 18 | Ht 67.0 in | Wt 222.0 lb

## 2015-10-13 DIAGNOSIS — R413 Other amnesia: Secondary | ICD-10-CM | POA: Insufficient documentation

## 2015-10-13 DIAGNOSIS — Z452 Encounter for adjustment and management of vascular access device: Secondary | ICD-10-CM | POA: Insufficient documentation

## 2015-10-13 DIAGNOSIS — R918 Other nonspecific abnormal finding of lung field: Secondary | ICD-10-CM

## 2015-10-13 DIAGNOSIS — F419 Anxiety disorder, unspecified: Secondary | ICD-10-CM

## 2015-10-13 DIAGNOSIS — J700 Acute pulmonary manifestations due to radiation: Secondary | ICD-10-CM | POA: Insufficient documentation

## 2015-10-13 DIAGNOSIS — F329 Major depressive disorder, single episode, unspecified: Secondary | ICD-10-CM | POA: Diagnosis not present

## 2015-10-13 DIAGNOSIS — R0609 Other forms of dyspnea: Secondary | ICD-10-CM | POA: Insufficient documentation

## 2015-10-13 DIAGNOSIS — Z79899 Other long term (current) drug therapy: Secondary | ICD-10-CM | POA: Diagnosis not present

## 2015-10-13 DIAGNOSIS — H539 Unspecified visual disturbance: Secondary | ICD-10-CM

## 2015-10-13 DIAGNOSIS — R05 Cough: Secondary | ICD-10-CM

## 2015-10-13 DIAGNOSIS — C8190 Hodgkin lymphoma, unspecified, unspecified site: Secondary | ICD-10-CM

## 2015-10-13 DIAGNOSIS — C8100 Nodular lymphocyte predominant Hodgkin lymphoma, unspecified site: Secondary | ICD-10-CM

## 2015-10-13 DIAGNOSIS — Z87891 Personal history of nicotine dependence: Secondary | ICD-10-CM | POA: Insufficient documentation

## 2015-10-13 DIAGNOSIS — C801 Malignant (primary) neoplasm, unspecified: Secondary | ICD-10-CM

## 2015-10-13 LAB — QUANTIFERON IN TUBE
QFT TB AG MINUS NIL VALUE: 0.06 IU/mL
QUANTIFERON MITOGEN VALUE: 9.64 IU/mL
QUANTIFERON NIL VALUE: 0.02 [IU]/mL
QUANTIFERON TB AG VALUE: 0.08 [IU]/mL
QUANTIFERON TB GOLD: NEGATIVE

## 2015-10-13 LAB — QUANTIFERON TB GOLD ASSAY (BLOOD)

## 2015-10-13 MED ORDER — SODIUM CHLORIDE 0.9 % IJ SOLN
10.0000 mL | INTRAMUSCULAR | Status: DC | PRN
  Administered 2015-10-13: 10 mL
  Filled 2015-10-13: qty 10

## 2015-10-13 MED ORDER — HEPARIN SOD (PORK) LOCK FLUSH 100 UNIT/ML IV SOLN
500.0000 [IU] | Freq: Once | INTRAVENOUS | Status: AC
  Administered 2015-10-13: 500 [IU] via INTRAVENOUS

## 2015-10-13 NOTE — Progress Notes (Signed)
Patient was treated with radiation by Dr. Baruch Gouty for [place]. He states that near the end of his treatment he began coughing a lot and having SOB with exertion. Patient states that Dr. Baruch Gouty did a CT Scan and sent him to pulmonary doctor. He saw pulmonary doctor and he is not sure if he has pneumonia or he it is from radiation treatments.

## 2015-10-13 NOTE — Progress Notes (Signed)
[facility] day:  10/13/2015  Chief Complaint: Alexander Humphrey is a 54 y.o. 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  HPI:  The patient was last seen in the medical oncology clinic on 09/04/2015.  At that time, he was seen for initial assessment.  He had just completed involved field radiation on 09/01/2015.   He denied any B symptoms.  Exam revealed no palpable adenopathy.  He was set up for PET scan 3 months post completion of radiation.  He saw Dr. Baruch Gouty on 09/30/2015.  At that time, he complained of night sweats, increased dyspnea on exertion, and a non-productive cough.  Exam revealed clear lungs.  A chest CT was ordered.  He was prescribed Decadron 4 mg a day and a Z-Pak.  He was referred to pulmonary medicine, Dr. Merton Border.  He saw Dr. Alva Garnet on 10/01/2015.  He noted left sided chest discomfort with inspiration, similar to his pneumonia at presentation with lymphoma.  He had a subjective fever 5 days prior to his visit.  He had not filled his prescriptions.  Exam revealed coarse breath sounds in the left upper lung.   He was felt to possibly be developing radiation pneumonitis.  He was encouraged to fill his prescriptions.  Chest CT on 10/08/2015 revealed biapical consolidative opacities with associated air bronchograms, left greater than right - nonspecific though could represent evolving radiation change, though an acute infectious process was not excluded on the basis of this exam. Correlation with prior outside examinations was recommended.  There was bulky left axillary lymphadenopathy (index node 1.6 cm) compatible with provided history of Hodgkin's lymphoma.  He followed up with Dr. Alva Garnet on 10/09/2015.  At that time, he was feeling better.  Night sweats had resolved.  He noted mild dyspnea on exertion and vague chest discomfort.   He denied any cough.  Weight was stable.  Review of chest CT raised the concern for opportunistic infections.  Quantiferon TB gold assay was ordered (result- negative).  Decadron was continued.  A chest x-ray in 3-4 weeks was planned with notation for bronchoscopy if infiltrates and/or symptoms persist.  Because of emphysema noted on chest x-ray, A1AT levels were checked.  The patient states that he completed his azithromycin.  He continues on Decadron.  He comments that his symptoms are "mostly going away".  He has a little shortness of breath.  Taking a deep breath "doesn't hurt as much".  He notes an increase in appetite on Decadron.  Past Medical History  Diagnosis Date  . Hodgkin's lymphoma ([place])   . Memory changes   . Changes in vision     due to eye injury  . Herniated disc, cervical   . Anxiety   . Depression     Past Surgical History  Procedure Laterality Date  . Hernia repair      Family History  Problem Relation Age of Onset  . Family history unknown: Yes    Social History:  reports that he quit smoking about 34 years ago. He has never used smokeless tobacco. He reports that he does not drink alcohol or use illicit drugs.  He previously lived in [place].  He is going back to [place] for the holidays.  The patient is alone today.  Allergies:  Allergies  Allergen Reactions  . Percocet [Oxycodone-Acetaminophen] Shortness Of Breath    Current Medications: Current Outpatient  Prescriptions  Medication Sig Dispense Refill  . carbamazepine (TEGRETOL) 200 MG tablet Take 400 mg by mouth 4 (four) times daily.    Marland Kitchen dexamethasone (DECADRON) 4 MG tablet Take 1 tablet (4 mg total) by mouth daily. 30 tablet 1  . FLUoxetine (PROZAC) 40 MG capsule Take 40 mg by mouth 2 (two) times daily.    . lansoprazole (PREVACID) 30 MG capsule Take 1 capsule (30 mg total) by mouth daily at 12 noon. 30 capsule 1  . sucralfate (CARAFATE) 1 G tablet Take 1 tablet (1 g total) by mouth 4  (four) times daily -  with meals and at bedtime. 90 tablet 3  . azithromycin (ZITHROMAX Z-PAK) 250 MG tablet 1 dose pack, follow package directions (Patient not taking: Reported on 10/13/2015) 6 each 0   No current facility-administered medications for this visit.    Review of Systems:  GENERAL:  Feels better.  Active.  No fevers or sweats.  Weight up 5 pounds. PERFORMANCE STATUS (ECOG):  1 HEENT:  Old nail trauma to left eye.  No visual changes, runny nose, mouth sores or tenderness. Lungs: Shortness of breath, improved.  Dry cough.  No hemoptysis. Cardiac:  No chest pain, palpitations, orthopnea, or PND. GI:  Good appetite.  No nausea, vomiting, diarrhea, constipation, melena or hematochezia. GU:  No urgency, frequency, dysuria, or hematuria. Musculoskeletal:  Chronic back problems.  No joint pain.  No muscle tenderness. Extremities:  No pain or swelling. Skin:  No rashes or skin changes. Neuro:  No headache, numbness or weakness, balance or coordination issues. Endocrine:  No diabetes, thyroid issues, hot flashes or night sweats. Psych:  No mood changes, depression or anxiety. Pain:  No focal pain. Review of systems:  All other systems reviewed and found to be negative.  Physical Exam: Blood pressure 144/97, pulse 94, temperature 96.7 F (35.9 C), temperature source Tympanic, resp. rate 18, height 5' 7"  (1.702 m), weight 222 lb 0.1 oz (100.7 kg), SpO2 96 %. GENERAL:  Well developed, well nourished, sitting comfortably in the exam room in no acute distress. MENTAL STATUS:  Alert and oriented to person, place and time. HEAD:  Lilyan Punt with goatee.  Normocephalic, atraumatic, face symmetric, no Cushingoid features. EYES:  Blue eyes.  Left eye dilated and deviated s/p old trauma.  Right pupil reactive to light.  No conjunctivitis or scleral icterus. ENT:  Oropharynx clear without lesion.  Tongue normal. Mucous membranes moist.  RESPIRATORY:  Clear to auscultation without rales, wheezes  or rhonchi.  Intermittent squeak in right lower lobe. CARDIOVASCULAR:  Regular rate and rhythm without murmur, rub or gallop. ABDOMEN:  Soft, non-tender, with active bowel sounds, and no hepatosplenomegaly.  No masses. SKIN:  Tan.  No rashes, ulcers or lesions. EXTREMITIES: No edema, no skin discoloration or tenderness.  No palpable cords. LYMPH NODES: No palpable cervical, supraclavicular, axillary or inguinal adenopathy  NEUROLOGICAL: Unremarkable. PSYCH:  Appropriate.  No visits with results within 3 Day(s) from this visit. Latest known visit with results is:  Appointment on 08/26/2015  Component Date Value Ref Range Status  . WBC 08/26/2015 4.2  3.8 - 10.6 K/uL Final  . RBC 08/26/2015 4.35* 4.40 - 5.90 MIL/uL Final  . Hemoglobin 08/26/2015 13.3  13.0 - 18.0 g/dL Final  . HCT 08/26/2015 39.0* 40.0 - 52.0 % Final  . MCV 08/26/2015 89.8  80.0 - 100.0 fL Final  . MCH 08/26/2015 30.7  26.0 - 34.0 pg Final  . MCHC 08/26/2015 34.2  32.0 - 36.0  g/dL Final  . RDW 08/26/2015 13.3  11.5 - 14.5 % Final  . Platelets 08/26/2015 193  150 - 440 K/uL Final    Assessment:  Alexander Humphrey is a 54 y.o. male with a history of stage IIA nodular lymphocyte Hodgkin's disease presnting with flu-like symptoms and pneumonia while living in [place]. Left axillary node biopsy on 05/11/2015 was consistent with nodular lymphocyte predominant Hodgkin's disease with focal increased number of LP Reed-Sternberg cells (reviewed by the [facility]).  He declined bone marrow aspirate and biopsy.   PET scan on 06/08/2015 revealed multiple small lymph nodes in the lower neck, supraclavicular area, and bilateral axillary lymph nodes. Many lymph nodes did not have corresponding FDG uptake.  There was a 1 cm left lower neck lymph node (SUV 3.3) and a 1.3 cm left supraclavicular lymph node (SUV 5.5). There were multiple enlarged left axillary nodes. There was a 7.3 cm collection without FDG uptake, likely iatrogenic.   There  was a focal area of FDG uptake at the posterior border of a 2.3 cm lymph node (SUV 9.6).  There was no abnormal FDG uptake in the mediastinum or lungs, abdomen or pelvis.  He received involved field radiation at [facility] from 08/15/2015 until 09/01/2015.  Chest CT on 10/08/2015 revealed biapical consolidative opacities with associated air bronchograms, left greater than right - nonspecific though could represent evolving radiation change, though an acute infectious process was not excluded on the basis of this exam. Correlation with prior outside examinations is recommended.  There was bulky left axillary lymphadenopathy (index node 1.6 cm) compatible with provided history of Hodgkin's lymphoma.  Quantiferon TB gold assay was negative.  He has just completed a Z-pack.  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  Clinically, he is improving.  Plan: 1.  Discuss interim events, evaluations, and chest CT. 2.  Continue Decadron as prescribed by Dr. Baruch Gouty. 3.  Follow-up CXR in 3-4 weeks per Dr. Alva Garnet. 4.  Discuss plan for bronchoscopy if infiltrates or symptoms persist. 5.  Review plan for PET scan 3 months after completion of radiation (scheduled for 12/08/2015). 6.  Discuss return to clinic if worsening symptoms or concerns. 7.  RTC on 12/14/2015 for MD assess, labs (CBC with diff, CMP, LDH, uric acid, ESR), and review of PET scan.   Lequita Asal, MD  10/13/2015, 3:51 PM

## 2015-10-14 ENCOUNTER — Encounter: Payer: Self-pay | Admitting: Hematology and Oncology

## 2015-10-15 ENCOUNTER — Ambulatory Visit: Payer: Medicare Other | Admitting: Hematology and Oncology

## 2015-10-15 ENCOUNTER — Other Ambulatory Visit: Payer: Self-pay

## 2015-10-15 DIAGNOSIS — C8100 Nodular lymphocyte predominant Hodgkin lymphoma, unspecified site: Secondary | ICD-10-CM

## 2015-10-16 ENCOUNTER — Ambulatory Visit: Payer: Medicare Other | Admitting: Hematology and Oncology

## 2015-10-28 ENCOUNTER — Ambulatory Visit: Payer: Medicare Other | Admitting: Radiation Oncology

## 2015-10-28 ENCOUNTER — Inpatient Hospital Stay: Admission: RE | Admit: 2015-10-28 | Payer: Medicare Other | Source: Ambulatory Visit | Admitting: Radiation Oncology

## 2015-11-06 ENCOUNTER — Encounter: Payer: Self-pay | Admitting: Pulmonary Disease

## 2015-11-06 ENCOUNTER — Ambulatory Visit (INDEPENDENT_AMBULATORY_CARE_PROVIDER_SITE_OTHER): Payer: Medicare Other | Admitting: Pulmonary Disease

## 2015-11-06 ENCOUNTER — Ambulatory Visit
Admission: RE | Admit: 2015-11-06 | Discharge: 2015-11-06 | Disposition: A | Discharge status: Home / Self Care | Payer: Medicare Other | Source: Ambulatory Visit | Attending: Pulmonary Disease | Admitting: Pulmonary Disease

## 2015-11-06 VITALS — BP 138/92 | HR 112 | Ht 67.0 in | Wt 226.0 lb

## 2015-11-06 DIAGNOSIS — R918 Other nonspecific abnormal finding of lung field: Secondary | ICD-10-CM

## 2015-11-06 DIAGNOSIS — J984 Other disorders of lung: Secondary | ICD-10-CM | POA: Diagnosis not present

## 2015-11-06 DIAGNOSIS — R0600 Dyspnea, unspecified: Secondary | ICD-10-CM | POA: Diagnosis not present

## 2015-11-06 DIAGNOSIS — Z23 Encounter for immunization: Secondary | ICD-10-CM | POA: Diagnosis not present

## 2015-11-06 DIAGNOSIS — Z87891 Personal history of nicotine dependence: Secondary | ICD-10-CM | POA: Insufficient documentation

## 2015-11-08 NOTE — Progress Notes (Signed)
PULMONARY OFFICE FOLLOW-UP NOTE  Requesting MD/Service: Berton Mount, MD/Rad Onc Date of initial consult: 10/01/15 Reason for consultation: dyspnea, chest pain   INITIAL HPI 10/01/15:  55 y.o. M diagnosed with Hodgkin's lymphoma in [place] in late 2015 after he was admitted with PNA and noted to have L axillary adenopathy. The diagnosis was made by axillary node biopsy. He came to this community because he has family here and he underwent XRT with reported remission/cure. However, he was seen by Dr Donella Stade on the day prior to this encounter and reported increased dyspnea and L sided chest discomfort with inspiration. He stated that these symptoms were similar to those that he experienced when he was initially diagnosed. Dr Donella Stade ordered azithromycin, dexamethasone and a CT of chest. He has not filled these prescriptions yet. He denies wt loss, sinus symptoms, neck pain, cough, sputum production, hemoptysis, exertional CP, abd pain, N/V/D, dysuria. He has had no unexplained wt loss and no documented fevers. INITIAL ASSESSMENT/PLAN: Acute dyspnea and vague chest discomfort H/O Hodgkin's Lymphoma - s/p XRT - Encouraged that he fill the prescriptions for azithromyzin and dexamethasone and take as prescribed by Dr Donella Stade - Follow up after CT chest and consider further evaluation and therapy  10/09/15 Office visit: Overall, he feels somewhat better. Night sweats resolved. He continues to have mild DOE and vague chest discomfort. No cough, hemoptysis, LE edema, calf tenderness, unexplained weight loss. CT chest 10/08/15: BUL L>R AS dz. Suspect emphysematous changes in lower lobes IMPRESSION/PLAN:   1) CT appearance suggests an inflammatory process, infectious vs noninfectious. Would include radiation pneumonitis in differential but appearance and distribution makes this less likely. H/O HL raises concern for opportunistic infections including mycobacterial infections. His symptoms, in particular  night sweats, seem to be improving.   - Plan: Quantiferon tb gold assay  - Cont dexamethasone at current dose (4 mg daily).   - ROV 3-4 weeks with CXR. If infiltrates and/or symptoms persist, will likely pursue FOB for BAL and TBBx 2) Hodgkin lymphoma - thought to be in remission   11/06/15 Office visit: Has completed course of azithromycin and dexamethasone. Overall feels much better, essentially back to baseline with no new complaints. Denies cough, hemoptysis, LE edema, calf tenderness, unexplained wt loss.   OBJ:  Filed Vitals:   11/06/15 1103  BP: 138/92  Pulse: 112  Height: 5\' 7"  (1.702 m)  Weight: 226 lb (102.513 kg)  SpO2: 96%    EXAM:  Gen: WDWN in NAD HEENT: All WNL Neck: NO LAN, no JVD noted Lungs: clear to ausc and perc Cardiovascular: Reg, no M noted Abdomen: Soft, NT +BS Ext: no C/C/E Neuro: CNs intact, motor/sens grossly intact Skin: radiation dermatitis upper L chest anteriorly and posteriorly    DATA:   CBC Latest Ref Rng 08/26/2015 08/19/2015 08/12/2015  WBC 3.8 - 10.6 K/uL 4.2 2.9(L) 2.9(L)  Hemoglobin 13.0 - 18.0 g/dL 13.3 13.6 14.2  Hematocrit 40.0 - 52.0 % 39.0(L) 39.0(L) 41.7  Platelets 150 - 440 K/uL 193 204 247    CXR 11/05/14: Chronic interstitial densities in both lungs. This may reflect post radiation change in the upper lobes. The interstitial markings elsewhere likely reflect the patient's previous smoking history or other interstitial process. No alveolar pneumonia nor CHF is demonstrated  IMPRESSION/PLAN:   1) Bilateral L>R upper lobe infiltrates. It is difficult to compare CXR to prior CT chest but infiltrates are at least stable if not improved. His respiratory symptoms are fully resolved. 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 However, pulmonary infiltrates need to be followed over time to ensure  stability or resolution. Follow up in 6 weeks with CXR. Flu vaccination administered today  Merton Border, MD PCCM service Mobile [phone] Pager [phone]

## 2015-11-27 ENCOUNTER — Inpatient Hospital Stay: Payer: Medicare Other

## 2015-12-08 ENCOUNTER — Inpatient Hospital Stay: Payer: Medicare Other | Attending: Hematology and Oncology

## 2015-12-08 ENCOUNTER — Ambulatory Visit
Admission: RE | Admit: 2015-12-08 | Discharge: 2015-12-08 | Disposition: A | Discharge status: Home / Self Care | Payer: Medicare Other | Source: Ambulatory Visit | Attending: Hematology and Oncology | Admitting: Hematology and Oncology

## 2015-12-08 DIAGNOSIS — J700 Acute pulmonary manifestations due to radiation: Secondary | ICD-10-CM | POA: Insufficient documentation

## 2015-12-08 DIAGNOSIS — C8110 Nodular sclerosis classical Hodgkin lymphoma, unspecified site: Secondary | ICD-10-CM | POA: Insufficient documentation

## 2015-12-08 DIAGNOSIS — M5020 Other cervical disc displacement, unspecified cervical region: Secondary | ICD-10-CM | POA: Insufficient documentation

## 2015-12-08 DIAGNOSIS — Z79899 Other long term (current) drug therapy: Secondary | ICD-10-CM | POA: Insufficient documentation

## 2015-12-08 DIAGNOSIS — R918 Other nonspecific abnormal finding of lung field: Secondary | ICD-10-CM | POA: Diagnosis not present

## 2015-12-08 DIAGNOSIS — Z87891 Personal history of nicotine dependence: Secondary | ICD-10-CM | POA: Insufficient documentation

## 2015-12-08 DIAGNOSIS — R413 Other amnesia: Secondary | ICD-10-CM | POA: Insufficient documentation

## 2015-12-08 DIAGNOSIS — K4090 Unilateral inguinal hernia, without obstruction or gangrene, not specified as recurrent: Secondary | ICD-10-CM | POA: Insufficient documentation

## 2015-12-08 DIAGNOSIS — C8190 Hodgkin lymphoma, unspecified, unspecified site: Secondary | ICD-10-CM | POA: Insufficient documentation

## 2015-12-08 DIAGNOSIS — F418 Other specified anxiety disorders: Secondary | ICD-10-CM | POA: Insufficient documentation

## 2015-12-08 DIAGNOSIS — Z923 Personal history of irradiation: Secondary | ICD-10-CM | POA: Insufficient documentation

## 2015-12-08 LAB — GLUCOSE, CAPILLARY: Glucose-Capillary: 99 mg/dL (ref 65–99)

## 2015-12-08 NOTE — Progress Notes (Signed)
Patient requested to have portflush done on 12/14/15 when his labs are due.

## 2015-12-09 ENCOUNTER — Encounter
Admission: RE | Admit: 2015-12-09 | Discharge: 2015-12-09 | Disposition: A | Discharge status: Home / Self Care | Payer: Medicare Other | Source: Ambulatory Visit | Attending: Hematology and Oncology | Admitting: Hematology and Oncology

## 2015-12-09 DIAGNOSIS — C8110 Nodular sclerosis classical Hodgkin lymphoma, unspecified site: Secondary | ICD-10-CM | POA: Insufficient documentation

## 2015-12-09 LAB — GLUCOSE, CAPILLARY: Glucose-Capillary: 99 mg/dL (ref 65–99)

## 2015-12-09 MED ORDER — FLUDEOXYGLUCOSE F - 18 (FDG) INJECTION
13.0200 | Freq: Once | INTRAVENOUS | Status: AC | PRN
  Administered 2015-12-09: 13.02 via INTRAVENOUS

## 2015-12-14 ENCOUNTER — Other Ambulatory Visit: Payer: Medicare Other

## 2015-12-14 ENCOUNTER — Ambulatory Visit: Payer: Medicare Other | Admitting: Internal Medicine

## 2015-12-14 ENCOUNTER — Ambulatory Visit: Payer: Medicare Other | Admitting: Hematology and Oncology

## 2015-12-15 ENCOUNTER — Inpatient Hospital Stay: Payer: Medicare Other

## 2015-12-15 ENCOUNTER — Inpatient Hospital Stay (HOSPITAL_BASED_OUTPATIENT_CLINIC_OR_DEPARTMENT_OTHER): Payer: Medicare Other | Admitting: Hematology and Oncology

## 2015-12-15 VITALS — BP 134/80 | HR 66 | Temp 98.7°F | Ht 67.0 in | Wt 228.4 lb

## 2015-12-15 DIAGNOSIS — C8100 Nodular lymphocyte predominant Hodgkin lymphoma, unspecified site: Secondary | ICD-10-CM

## 2015-12-15 DIAGNOSIS — C8190 Hodgkin lymphoma, unspecified, unspecified site: Secondary | ICD-10-CM | POA: Diagnosis not present

## 2015-12-15 DIAGNOSIS — R413 Other amnesia: Secondary | ICD-10-CM

## 2015-12-15 DIAGNOSIS — Z87891 Personal history of nicotine dependence: Secondary | ICD-10-CM | POA: Diagnosis not present

## 2015-12-15 DIAGNOSIS — Z923 Personal history of irradiation: Secondary | ICD-10-CM | POA: Diagnosis not present

## 2015-12-15 DIAGNOSIS — Z79899 Other long term (current) drug therapy: Secondary | ICD-10-CM | POA: Diagnosis not present

## 2015-12-15 DIAGNOSIS — K4090 Unilateral inguinal hernia, without obstruction or gangrene, not specified as recurrent: Secondary | ICD-10-CM | POA: Diagnosis not present

## 2015-12-15 DIAGNOSIS — F418 Other specified anxiety disorders: Secondary | ICD-10-CM

## 2015-12-15 DIAGNOSIS — M5020 Other cervical disc displacement, unspecified cervical region: Secondary | ICD-10-CM

## 2015-12-15 DIAGNOSIS — Z95828 Presence of other vascular implants and grafts: Secondary | ICD-10-CM

## 2015-12-15 DIAGNOSIS — J700 Acute pulmonary manifestations due to radiation: Secondary | ICD-10-CM | POA: Diagnosis not present

## 2015-12-15 LAB — CBC WITH DIFFERENTIAL/PLATELET
Basophils Absolute: 0 10*3/uL (ref 0–0.1)
Basophils Relative: 1 %
Eosinophils Absolute: 0.1 10*3/uL (ref 0–0.7)
Eosinophils Relative: 2 %
HCT: 39.4 % — ABNORMAL LOW (ref 40.0–52.0)
Hemoglobin: 13.6 g/dL (ref 13.0–18.0)
Lymphocytes Relative: 16 %
Lymphs Abs: 0.5 10*3/uL — ABNORMAL LOW (ref 1.0–3.6)
MCH: 30.8 pg (ref 26.0–34.0)
MCHC: 34.4 g/dL (ref 32.0–36.0)
MCV: 89.6 fL (ref 80.0–100.0)
Monocytes Absolute: 0.3 10*3/uL (ref 0.2–1.0)
Monocytes Relative: 10 %
Neutro Abs: 2.3 10*3/uL (ref 1.4–6.5)
Neutrophils Relative %: 71 %
Platelets: 227 10*3/uL (ref 150–440)
RBC: 4.4 MIL/uL (ref 4.40–5.90)
RDW: 14.7 % — ABNORMAL HIGH (ref 11.5–14.5)
WBC: 3.2 10*3/uL — ABNORMAL LOW (ref 3.8–10.6)

## 2015-12-15 LAB — URIC ACID: Uric Acid, Serum: 4.5 mg/dL (ref 4.4–7.6)

## 2015-12-15 LAB — LACTATE DEHYDROGENASE: LDH: 187 U/L (ref 98–192)

## 2015-12-15 LAB — COMPREHENSIVE METABOLIC PANEL
ALT: 17 U/L (ref 17–63)
AST: 21 U/L (ref 15–41)
Albumin: 4.3 g/dL (ref 3.5–5.0)
Alkaline Phosphatase: 79 U/L (ref 38–126)
Anion gap: 4 — ABNORMAL LOW (ref 5–15)
BUN: 10 mg/dL (ref 6–20)
CO2: 26 mmol/L (ref 22–32)
Calcium: 8.9 mg/dL (ref 8.9–10.3)
Chloride: 105 mmol/L (ref 101–111)
Creatinine, Ser: 0.71 mg/dL (ref 0.61–1.24)
GFR calc Af Amer: >60 mL/min (ref >60)
GFR calc non Af Amer: >60 mL/min (ref >60)
Glucose, Bld: 112 mg/dL — ABNORMAL HIGH (ref 65–99)
Potassium: 4 mmol/L (ref 3.5–5.1)
Sodium: 135 mmol/L (ref 135–145)
Total Bilirubin: 0.5 mg/dL (ref 0.3–1.2)
Total Protein: 7 g/dL (ref 6.5–8.1)

## 2015-12-15 LAB — SEDIMENTATION RATE: Sed Rate: 10 mm/hr (ref 0–20)

## 2015-12-15 MED ORDER — SODIUM CHLORIDE 0.9% FLUSH
10.0000 mL | INTRAVENOUS | Status: DC | PRN
  Administered 2015-12-15: 10 mL via INTRAVENOUS
  Filled 2015-12-15: qty 10

## 2015-12-15 MED ORDER — HEPARIN SOD (PORK) LOCK FLUSH 100 UNIT/ML IV SOLN
500.0000 [IU] | Freq: Once | INTRAVENOUS | Status: AC
  Administered 2015-12-15: 500 [IU] via INTRAVENOUS

## 2015-12-15 NOTE — Progress Notes (Signed)
[facility] day:  12/15/2015  Chief Complaint: Alexander Humphrey is a 55 y.o. 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  HPI:  The patient was last seen in the medical oncology clinic on 10/13/2015.  At that time, he was seen for reassessment after interval chest CT.  Chest CT on 10/08/2015 revealed biapical consolidative opacities with associated air bronchograms, left greater than right - nonspecific though could represent evolving radiation change, though an acute infectious process was not excluded on the basis of this exam. There was bulky left axillary lymphadenopathy (index node 1.6 cm) compatible with provided history of Hodgkin's lymphoma.  Quantiferon TB gold assay was negative.  He had just completed a Z-pack.  He was on Decadron 4 mg a day for presumed radiation pneumonitis.  Clinically, he was improving.  CXR on 11/06/2015 revealed chronic interstitial densities in both lungs. This was felt to possibly represent post radiation change in the upper lobes. The interstitial markings elsewhere likely reflected the patient's previous smoking history or other interstitial process.  There was no alveolar pneumonia nor CHF demonstrated.  He was to follow-up with Dr. Alva Garnet.  PET scan was planned 3 months after completion of radiation.  PET scan on 12/09/2015 revealed mildly hypermetabolic left axillary adenopathy, consistent with the diagnosis of Hodgkin lymphoma. Lymph node size was grossly stable (1.7 cm; SUV 3.4) from 10/08/2015. No additional hypermetabolic adenopathy in the neck, chest, abdomen or pelvis.  There was patchy consolidation in the upper lobes, left greater than right, with low level FDG avidity. CT appearance was minimally improved from 10/08/2015. Findings may be due to radiation therapy. Continued attention on followup  exams was warranted.  Thre was small amount of fluid within a left inguinal hernia shows mild hypermetabolism, likely inflammatory in etiology.  Symptomatically, he is doing well.  He saw Dr. Leonidas Romberg, pulmonologist, in follow-up.  He has been off his steroids for a month.  He denies any respiratory symptoms.  He denies any fevers, sweats, or weight loss.  He denies any bruising or bleeding.  Past Medical History  Diagnosis Date  . Hodgkin's lymphoma ([place])   . Memory changes   . Changes in vision     due to eye injury  . Herniated disc, cervical   . Anxiety   . Depression     Past Surgical History  Procedure Laterality Date  . Hernia repair      Family History  Problem Relation Age of Onset  . Family history unknown: Yes    Social History:  reports that he quit smoking about 35 years ago. He has never used smokeless tobacco. He reports that he does not drink alcohol or use illicit drugs.  He previously lived in [place].  He is planning on moving back to [place].  The patient is accompanied by his sister today.  Allergies:  Allergies  Allergen Reactions  . Percocet [Oxycodone-Acetaminophen] Shortness Of Breath    Current Medications: Current Outpatient Prescriptions  Medication Sig Dispense Refill  . carbamazepine (TEGRETOL) 200 MG tablet Take 400 mg by mouth 4 (four) times daily.    Marland Kitchen FLUoxetine (PROZAC) 40 MG capsule Take 40 mg by mouth 2 (two) times daily.     No current facility-administered medications for this visit.    Review of Systems:  GENERAL:  Feels good.  No fevers or sweats.  Weight down 1 pound. PERFORMANCE STATUS (ECOG):  1 HEENT:  Old nail trauma to left eye.  No visual changes, runny nose, mouth sores or tenderness. Lungs: No shortness of breath or cough.  No hemoptysis. Cardiac:  No chest pain, palpitations, orthopnea, or PND. GI:  Good appetite.  No nausea, vomiting, diarrhea, constipation, melena or hematochezia. GU:  No urgency,  frequency, dysuria, or hematuria. Musculoskeletal:  Chronic back problems.  No joint pain.  No muscle tenderness. Extremities:  No pain or swelling. Skin:  No rashes or skin changes. Neuro:  No headache, numbness or weakness, balance or coordination issues. Endocrine:  No diabetes, thyroid issues, hot flashes or night sweats. Psych:  No mood changes, depression or anxiety. Pain:  No focal pain. Review of systems:  All other systems reviewed and found to be negative.  Physical Exam: Blood pressure 134/80, pulse 66, temperature 98.7 F (37.1 C), temperature source Tympanic, height 5' 7"  (1.702 m), weight 228 lb 6.3 oz (103.6 kg). GENERAL:  Well developed, well nourished, sitting comfortably in the exam room in no acute distress. MENTAL STATUS:  Alert and oriented to person, place and time. HEAD:  Wearing a cap.  Gray hair with goatee.  Normocephalic, atraumatic, face symmetric, no Cushingoid features. EYES:  Blue eyes.  Left eye dilated and deviated s/p old trauma.  Right pupil reactive to light.  No conjunctivitis or scleral icterus. ENT:  Oropharynx clear without lesion.  Tongue normal. Mucous membranes moist.  RESPIRATORY:  Clear to auscultation without rales, wheezes or rhonchi.  Intermittent squeak in right lower lobe. CARDIOVASCULAR:  Regular rate and rhythm without murmur, rub or gallop. CHEST:  Port-a-cath. ABDOMEN:  Soft, non-tender, with active bowel sounds, and no hepatosplenomegaly.  No masses. SKIN:  Tan.  No rashes, ulcers or lesions. EXTREMITIES: No edema, no skin discoloration or tenderness.  No palpable cords. LYMPH NODES: No palpable cervical, supraclavicular, axillary or inguinal adenopathy  NEUROLOGICAL: Unremarkable. PSYCH:  Appropriate.  Appointment on 12/15/2015  Component Date Value Ref Range Status  . WBC 12/15/2015 3.2* 3.8 - 10.6 K/uL Final  . RBC 12/15/2015 4.40  4.40 - 5.90 MIL/uL Final  . Hemoglobin 12/15/2015 13.6  13.0 - 18.0 g/dL Final  . HCT 12/15/2015  39.4* 40.0 - 52.0 % Final  . MCV 12/15/2015 89.6  80.0 - 100.0 fL Final  . MCH 12/15/2015 30.8  26.0 - 34.0 pg Final  . MCHC 12/15/2015 34.4  32.0 - 36.0 g/dL Final  . RDW 12/15/2015 14.7* 11.5 - 14.5 % Final  . Platelets 12/15/2015 227  150 - 440 K/uL Final  . Neutrophils Relative % 12/15/2015 71   Final  . Neutro Abs 12/15/2015 2.3  1.4 - 6.5 K/uL Final  . Lymphocytes Relative 12/15/2015 16   Final  . Lymphs Abs 12/15/2015 0.5* 1.0 - 3.6 K/uL Final  . Monocytes Relative 12/15/2015 10   Final  . Monocytes Absolute 12/15/2015 0.3  0.2 - 1.0 K/uL Final  . Eosinophils Relative 12/15/2015 2   Final  . Eosinophils Absolute 12/15/2015 0.1  0 - 0.7 K/uL Final  . Basophils Relative 12/15/2015 1   Final  . Basophils Absolute 12/15/2015 0.0  0 - 0.1 K/uL Final  . LDH 12/15/2015 187  98 - 192 U/L Final  . Sodium 12/15/2015 135  135 - 145 mmol/L Final  . Potassium 12/15/2015 4.0  3.5 - 5.1 mmol/L Final  . Chloride 12/15/2015 105  101 - 111 mmol/L Final  . CO2 12/15/2015 26  22 -  32 mmol/L Final  . Glucose, Bld 12/15/2015 112* 65 - 99 mg/dL Final  . BUN 12/15/2015 10  6 - 20 mg/dL Final  . Creatinine, Ser 12/15/2015 0.71  0.61 - 1.24 mg/dL Final  . Calcium 12/15/2015 8.9  8.9 - 10.3 mg/dL Final  . Total Protein 12/15/2015 7.0  6.5 - 8.1 g/dL Final  . Albumin 12/15/2015 4.3  3.5 - 5.0 g/dL Final  . AST 12/15/2015 21  15 - 41 U/L Final  . ALT 12/15/2015 17  17 - 63 U/L Final  . Alkaline Phosphatase 12/15/2015 79  38 - 126 U/L Final  . Total Bilirubin 12/15/2015 0.5  0.3 - 1.2 mg/dL Final  . GFR calc non Af Amer 12/15/2015 >60  >60 mL/min Final  . GFR calc Af Amer 12/15/2015 >60  >60 mL/min Final   Comment: (NOTE) The eGFR has been calculated using the CKD EPI equation. This calculation has not been validated in all clinical situations. eGFR's persistently <60 mL/min signify possible Chronic Kidney Disease.   . Anion gap 12/15/2015 4* 5 - 15 Final  . Uric Acid, Serum 12/15/2015 4.5  4.4 -  7.6 mg/dL Final  . Sed Rate 12/15/2015 10  0 - 20 mm/hr Final    Assessment:  Alexander Humphrey is a 55 y.o. male with a history of stage IIA nodular lymphocyte Hodgkin's disease presnting with flu-like symptoms and pneumonia while living in [place]. Left axillary node biopsy on 05/11/2015 was consistent with nodular lymphocyte predominant Hodgkin's disease with focal increased number of LP Reed-Sternberg cells (reviewed by the [facility]).  He declined bone marrow aspirate and biopsy.   PET scan on 06/08/2015 revealed multiple small lymph nodes in the lower neck, supraclavicular area, and bilateral axillary lymph nodes. Many lymph nodes did not have corresponding FDG uptake.  There was a 1 cm left lower neck lymph node (SUV 3.3) and a 1.3 cm left supraclavicular lymph node (SUV 5.5). There were multiple enlarged left axillary nodes. There was a 7.3 cm collection without FDG uptake, likely iatrogenic.   There was a focal area of FDG uptake at the posterior border of a 2.3 cm lymph node (SUV 9.6).  There was no abnormal FDG uptake in the mediastinum or lungs, abdomen or pelvis.  He received involved field radiation at [facility] from 08/15/2015 until 09/01/2015.  Course was complicated by radiation pneumonitis treated with Decadron.  He has been off steroids x 1 month.  Chest CT on 10/08/2015 revealed biapical consolidative opacities with associated air bronchograms, left greater than right - nonspecific though could represent evolving radiation change, though an acute infectious process was not excluded on the basis of this exam. Correlation with prior outside examinations is recommended.  There was bulky left axillary lymphadenopathy (index node 1.6 cm) compatible with provided history of Hodgkin's lymphoma.  Quantiferon TB gold assay was negative.  PET scan on 12/09/2015 revealed mildly hypermetabolic left axillary adenopathy, consistent with the diagnosis of Hodgkin lymphoma. Lymph node size was grossly  stable (1.7 cm; SUV 3.4) from 10/08/2015. No additional hypermetabolic adenopathy in the neck, chest, abdomen or pelvis.  There was patchy consolidation in the upper lobes, left greater than right, with low level FDG avidity. CT appearance was minimally improved from 10/08/2015. Findings may be due to radiation therapy. Continued attention on followup exams was warranted.  Thre was small amount of fluid within a left inguinal hernia shows mild hypermetabolism, likely inflammatory in etiology.  Symptomatically, he voices no complaints.  Exam is unremarkable.  Plan: 1.  Labs today:  CBC with diff, CMP, LDH, uric acid, ESR. 2.  Discuss PET scan.  Anticipate follow-up imaging in 6 months. 3.  Port flush today and every 6-8 weeks until removed. 4.  ROI for patient when moves back to [place] to establish care. 5.  RTC in 3 month for MD assess, labs (CBC with diff, CMP, LDH, ESR).   Lequita Asal, MD  12/15/2015, 5:36 PM

## 2015-12-16 ENCOUNTER — Ambulatory Visit
Admission: RE | Admit: 2015-12-16 | Discharge: 2015-12-16 | Disposition: A | Discharge status: Home / Self Care | Payer: Medicare Other | Source: Ambulatory Visit | Attending: Pulmonary Disease | Admitting: Pulmonary Disease

## 2015-12-16 ENCOUNTER — Encounter: Payer: Self-pay | Admitting: Pulmonary Disease

## 2015-12-16 ENCOUNTER — Ambulatory Visit (INDEPENDENT_AMBULATORY_CARE_PROVIDER_SITE_OTHER): Payer: Medicare Other | Admitting: Pulmonary Disease

## 2015-12-16 VITALS — BP 126/74 | HR 74 | Ht 67.0 in | Wt 227.8 lb

## 2015-12-16 DIAGNOSIS — J701 Chronic and other pulmonary manifestations due to radiation: Secondary | ICD-10-CM | POA: Diagnosis not present

## 2015-12-16 DIAGNOSIS — R918 Other nonspecific abnormal finding of lung field: Secondary | ICD-10-CM | POA: Insufficient documentation

## 2015-12-16 DIAGNOSIS — R0600 Dyspnea, unspecified: Secondary | ICD-10-CM

## 2015-12-16 DIAGNOSIS — IMO0002 Reserved for concepts with insufficient information to code with codable children: Secondary | ICD-10-CM

## 2015-12-16 NOTE — Patient Instructions (Signed)
We will try to assist you in obtaining images of your chest on a CD for you to take with you if/when you return to [facility] Follow up as needed

## 2015-12-19 NOTE — Progress Notes (Signed)
PULMONARY OFFICE FOLLOW-UP NOTE  Requesting MD/Service: Berton Mount, MD/Rad Onc Date of initial consult: 10/01/15 Reason for consultation: dyspnea, chest pain   INITIAL HPI 10/01/15:  55 y.o. M diagnosed with Hodgkin's lymphoma in [place] in late 2015 after he was admitted with PNA and noted to have L axillary adenopathy. The diagnosis was made by axillary node biopsy. He came to this community because he has family here and he underwent XRT with reported remission/cure. However, he was seen by Dr Donella Stade on the day prior to this encounter and reported increased dyspnea and L sided chest discomfort with inspiration. He stated that these symptoms were similar to those that he experienced when he was initially diagnosed. Dr Donella Stade ordered azithromycin, dexamethasone and a CT of chest. He has not filled these prescriptions yet. He denies wt loss, sinus symptoms, neck pain, cough, sputum production, hemoptysis, exertional CP, abd pain, N/V/D, dysuria. He has had no unexplained wt loss and no documented fevers. INITIAL ASSESSMENT/PLAN: Acute dyspnea and vague chest discomfort H/O Hodgkin's Lymphoma - s/p XRT - Encouraged that he fill the prescriptions for azithromyzin and dexamethasone and take as prescribed by Dr Donella Stade - Follow up after CT chest and consider further evaluation and therapy  10/09/15 Office visit: Overall, he feels somewhat better. Night sweats resolved. He continues to have mild DOE and vague chest discomfort. No cough, hemoptysis, LE edema, calf tenderness, unexplained weight loss. CT chest 10/08/15: BUL L>R AS dz. Suspect emphysematous changes in lower lobes IMPRESSION/PLAN:   1) CT appearance suggests an inflammatory process, infectious vs noninfectious. Would include radiation pneumonitis in differential but appearance and distribution makes this less likely. H/O HL raises concern for opportunistic infections including mycobacterial infections. His symptoms, in particular  night sweats, seem to be improving.   - Plan: Quantiferon tb gold assay  - Cont dexamethasone at current dose (4 mg daily).   - ROV 3-4 weeks with CXR. If infiltrates and/or symptoms persist, will likely pursue FOB for BAL and TBBx 2) Hodgkin lymphoma - thought to be in remission   11/06/15 Office visit: Has completed course of azithromycin and dexamethasone. Overall feels much better, essentially back to baseline with no new complaints. Denies cough, hemoptysis, LE edema, calf tenderness, unexplained wt loss. No new therapies initiated. F/U scheduled with CXR  12/16/15 Office visit: "Doing great." No new complaints. Back to normal. Working out in gym again. CXR revealed persistent LUL opacity - unchanged. Deemed radiation fibrosis. Planning to return to [place]:   12/16/15 1120  BP: 126/74  Pulse: 74  Height: 5\' 7"  (1.702 m)  Weight: 227 lb 12.8 oz (103.329 kg)  SpO2: 99%    EXAM:  Gen: WDWN in NAD HEENT: All WNL Neck: NO LAN, no JVD noted Lungs: clear to ausc and perc Cardiovascular: Reg, no M noted Abdomen: Soft, NT +BS Ext: no C/C/E Neuro: CNs intact, motor/sens grossly intact Skin: radiation dermatitis upper L chest anteriorly and posteriorly    DATA:   CBC Latest Ref Rng 12/15/2015 08/26/2015 08/19/2015  WBC 3.8 - 10.6 K/uL 3.2(L) 4.2 2.9(L)  Hemoglobin 13.0 - 18.0 g/dL 13.6 13.3 13.6  Hematocrit 40.0 - 52.0 % 39.4(L) 39.0(L) 39.0(L)  Platelets 150 - 440 K/uL 227 193 204    CXR 12/16/15: [place]. 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   2) recent febrile illness - suspect PNA, now treated and resolved  3) Hodgkin lymphoma - thought to be in remission  PLAN: No further eval or therapy planned at this time.  I have arranged for Radiology department to copy his CXR and CT chest onto disc for him to have with him when he  returns to [facility] Follow up PRN  Merton Border, MD PCCM service Mobile [phone] Pager [phone]

## 2015-12-20 ENCOUNTER — Encounter: Payer: Self-pay | Admitting: Hematology and Oncology

## 2016-01-26 ENCOUNTER — Inpatient Hospital Stay: Payer: Medicare Other | Attending: Hematology and Oncology

## 2016-02-17 IMAGING — CT CT CHEST W/ CM
2 of 3 series · 16 of 46 positions shown, 18 images · IV contrast (omnipaque)
Comparison: None.

CLINICAL DATA: Sternal chest pain shortness of breath for several
weeks. History of radiation therapy to the supraclavicular and left
axillary region for lymphoma. History of recurrent pneumonia.
Dyspnea on exertion.

EXAM:
CT CHEST WITH CONTRAST
TECHNIQUE: Multidetector CT imaging of the chest was performed during
intravenous contrast administration.
CONTRAST:  75mL OMNIPAQUE IOHEXOL 300 MG/ML  SOLN

[Series 2: routine chest with · axial · 0.72mm/px · z∈[+28,+288]mm · 13 of 60 slices shown, 15 images]
[im 4/60  soft-tissue]
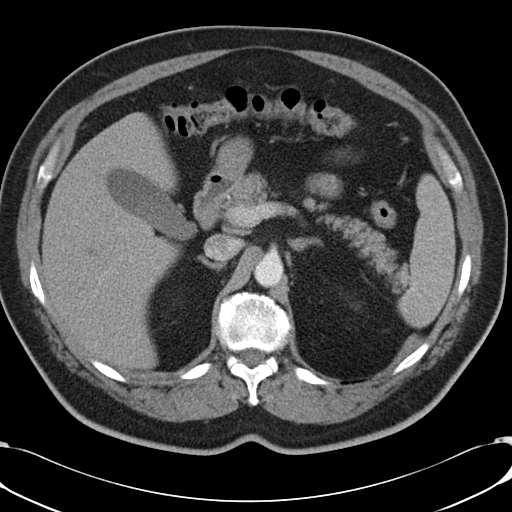
[im 4/60  bone]
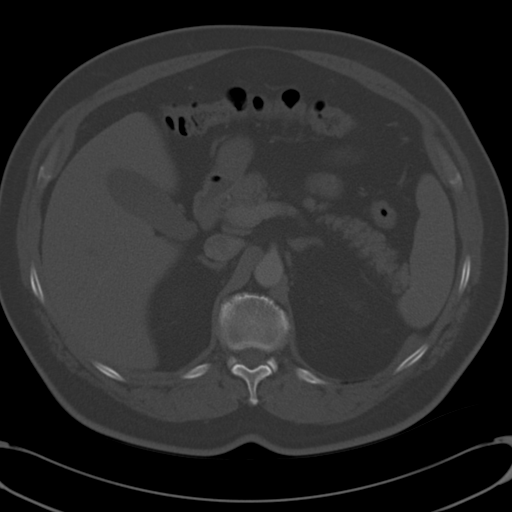
[im 8/60  soft-tissue]
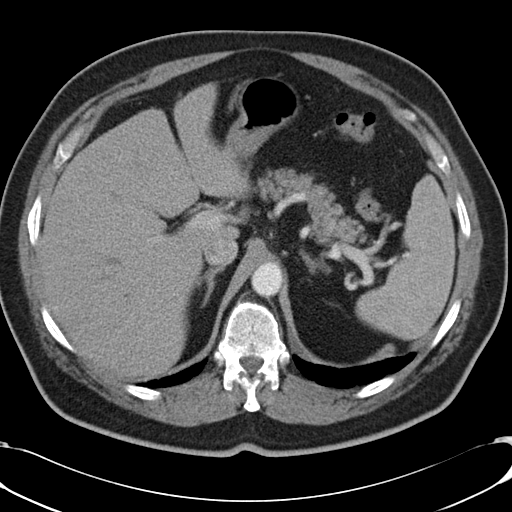
[im 12/60  soft-tissue]
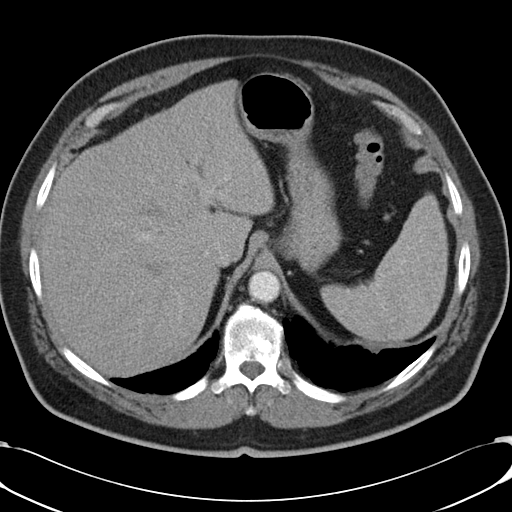
[im 18/60  soft-tissue]
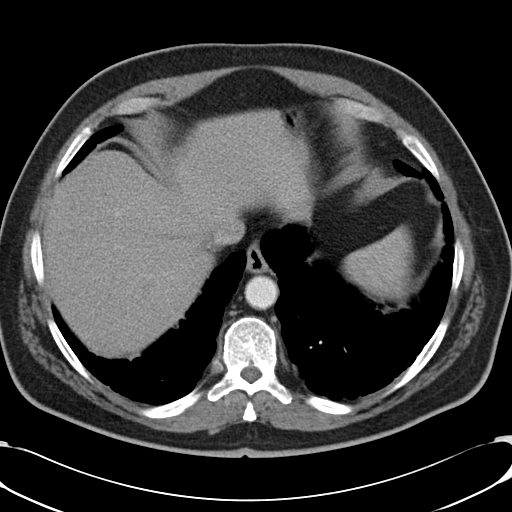
[im 21/60  soft-tissue]
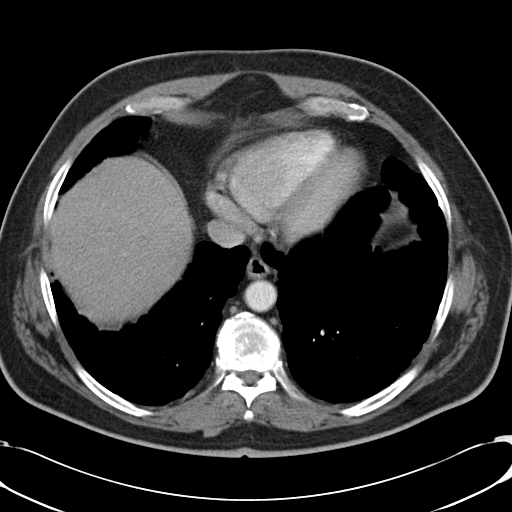
[im 25/60  soft-tissue]
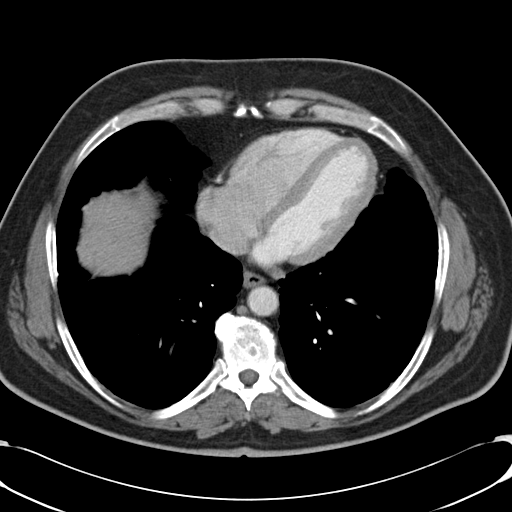
[im 31/60  soft-tissue]
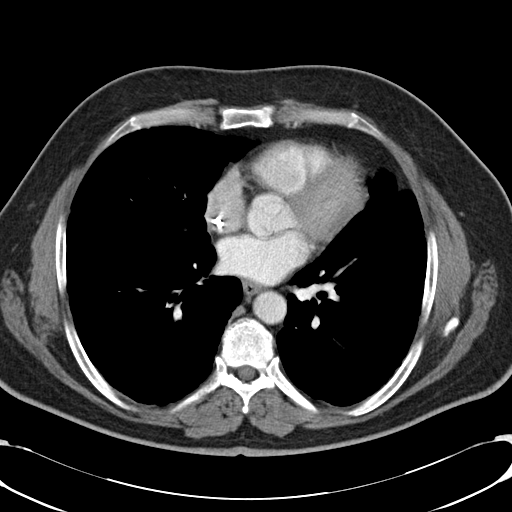
[im 35/60  soft-tissue]
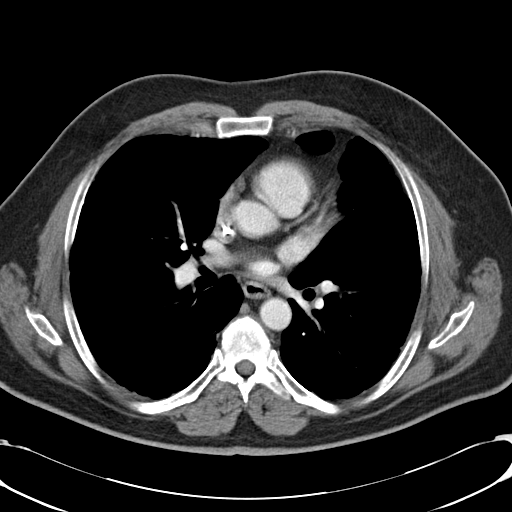
[im 39/60  soft-tissue]
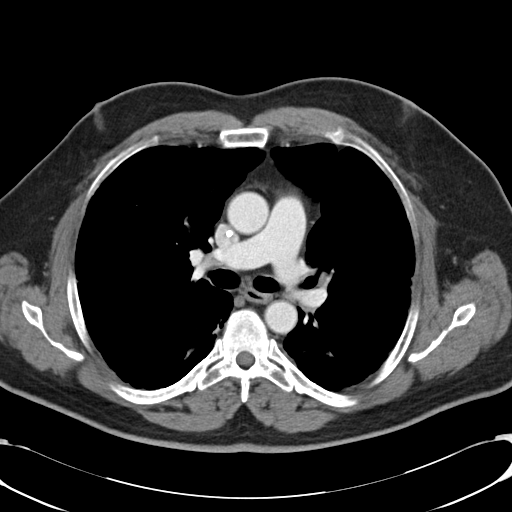
[im 39/60  bone]
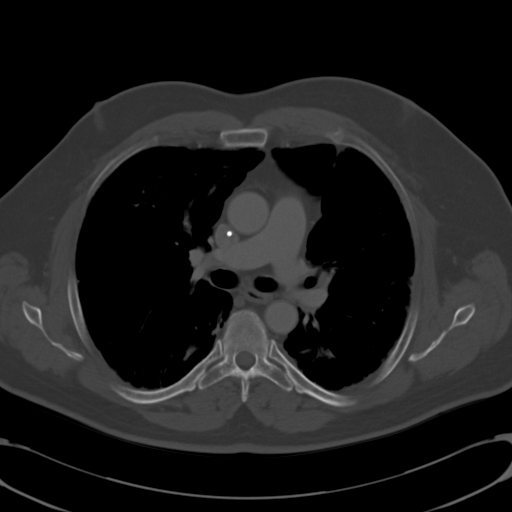
[im 42/60  soft-tissue]
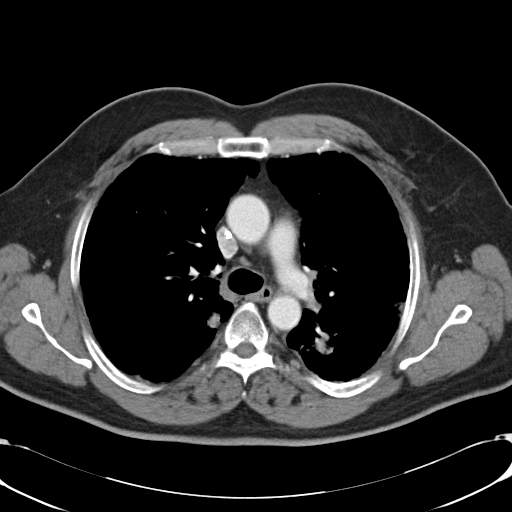
[im 48/60  soft-tissue]
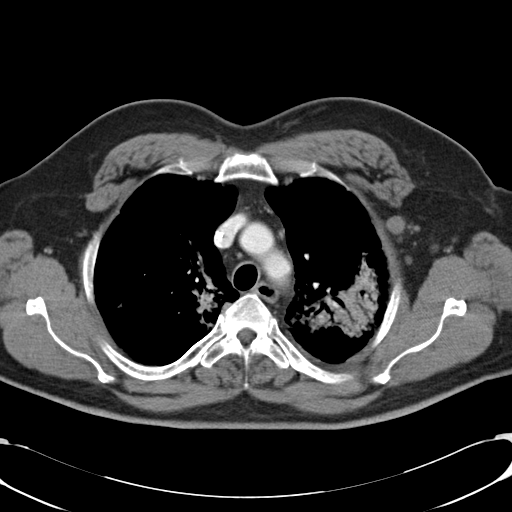
[im 52/60  soft-tissue]
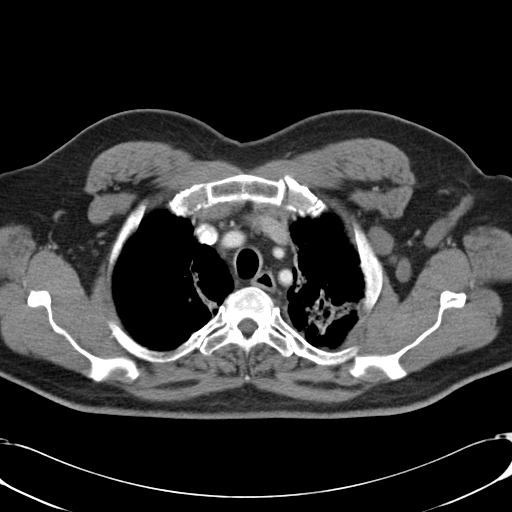
[im 56/60  soft-tissue]
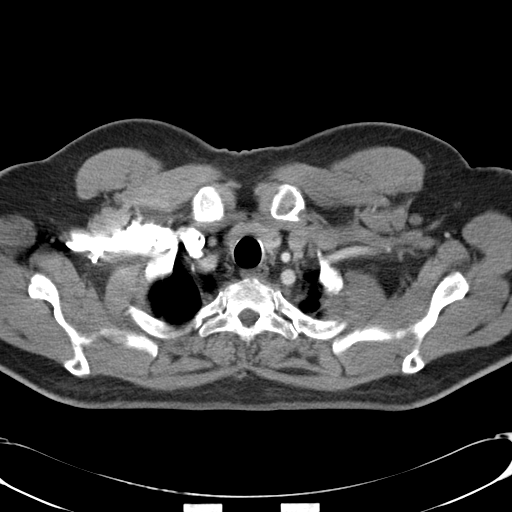

[Series 5: routine chest with cor · coronal · 0.61mm/px · 3 of 147 slices shown]
[im 49/147  soft-tissue]
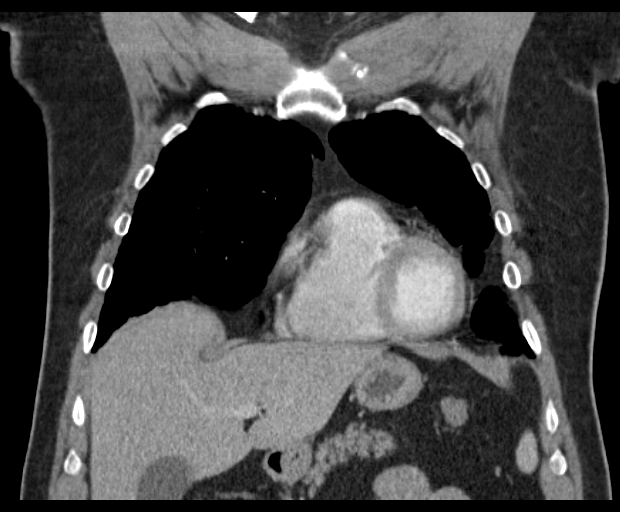
[im 65/147  soft-tissue]
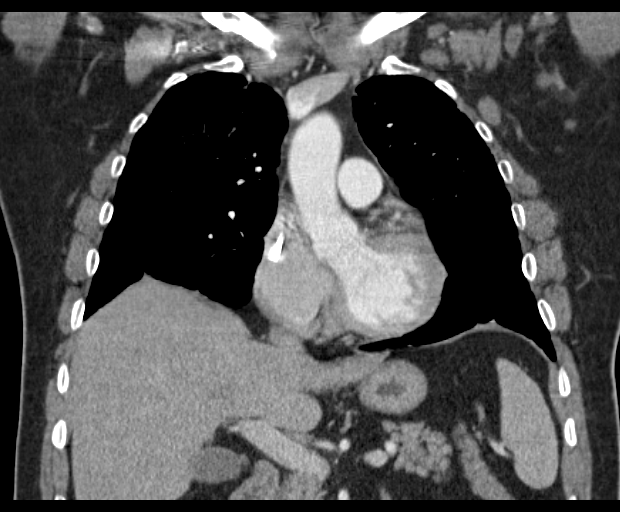
[im 82/147  soft-tissue]
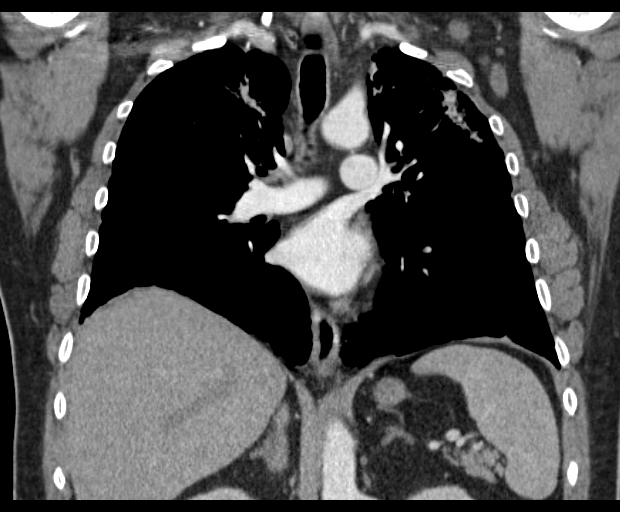

[16 of 46 positions shown; findings below may reference images not displayed]

FINDINGS: There are apical predominant consolidative opacities within the
bilateral upper lobes, left greater than right, with associated air
bronchograms (representative image 12, series 3). There is scattered
reticular opacities within the peripheral aspects of the remainder
of the lungs bilaterally though there are no additional air
bronchograms identified. No definitive volume loss or evidence of
bronchiectasis. No pleural effusion or pneumothorax.

Bulky left axillary lymphadenopathy with index left axillary lymph
node measure 1.6 cm in greatest short axis diameter (image 9, series
2). No mediastinal or hilar lymphadenopathy.

Normal heart size. No pericardial effusion. Although this
examination was not tailored for the evaluation the pulmonary
arteries, there are no discrete filling defects within the central
pulmonary arterial tree to suggest central pulmonary embolism.
Scattered atherosclerotic plaque within a normal caliber thoracic
aorta. Bovine configuration of the aortic arch. The branch vessels
of the aortic arch appear widely patent throughout their imaged
course.

Right internal jugular approach port a catheter tip terminates
within the superior cavoatrial junction.

Limited visualization of the upper abdomen is normal.

No acute or aggressive osseous abnormalities. Stigmata of DISH
within the thoracic spine.

Regional soft tissues appear normal. Normal appearance of the
thyroid gland.
IMPRESSION: 1. Biapical consolidative opacities with associated air
bronchograms, left greater than right - nonspecific though given
provided history of ongoing radiation therapy could represent
evolving radiation change, though an acute infectious process is not
excluded on the basis of this examination. Correlation with prior
outside examinations is recommended.
2. Bulky left axillary lymphadenopathy compatible with provided
history of Hodgkin's lymphoma.

## 2016-03-15 ENCOUNTER — Other Ambulatory Visit: Payer: Medicare Other

## 2016-03-15 ENCOUNTER — Ambulatory Visit: Payer: Medicare Other | Admitting: Hematology and Oncology

## 2016-03-17 ENCOUNTER — Other Ambulatory Visit: Payer: Medicare Other

## 2016-03-17 ENCOUNTER — Ambulatory Visit: Payer: Medicare Other | Admitting: Hematology and Oncology

## 2016-04-26 IMAGING — CR DG CHEST 2V
2 series · 2 of 2 positions shown · non-contrast
Comparison: 11/06/2015

CLINICAL DATA: Followup bilateral apical opacities, current history
of Hodgkin's lymphoma

EXAM:
CHEST  2 VIEW

[chest pa]
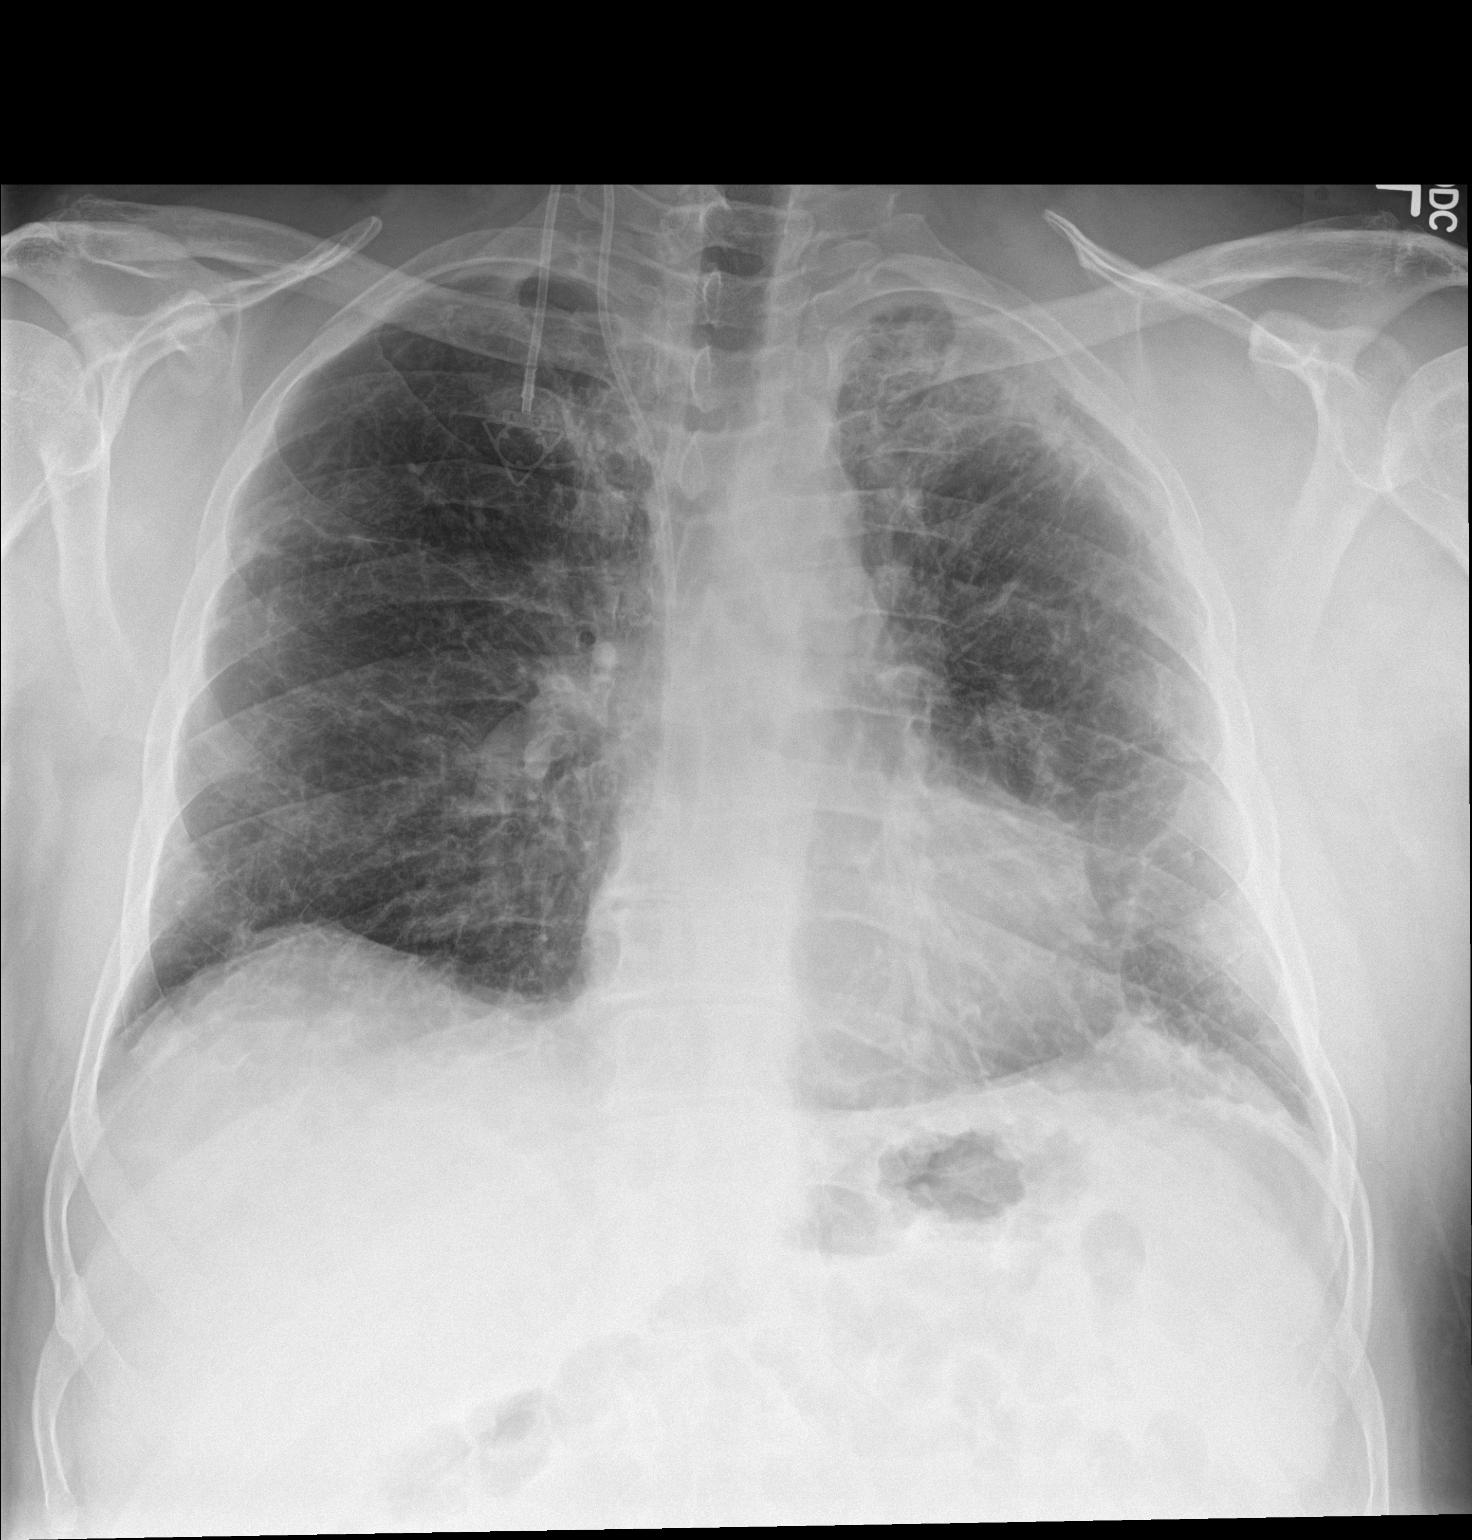

[chest lat]
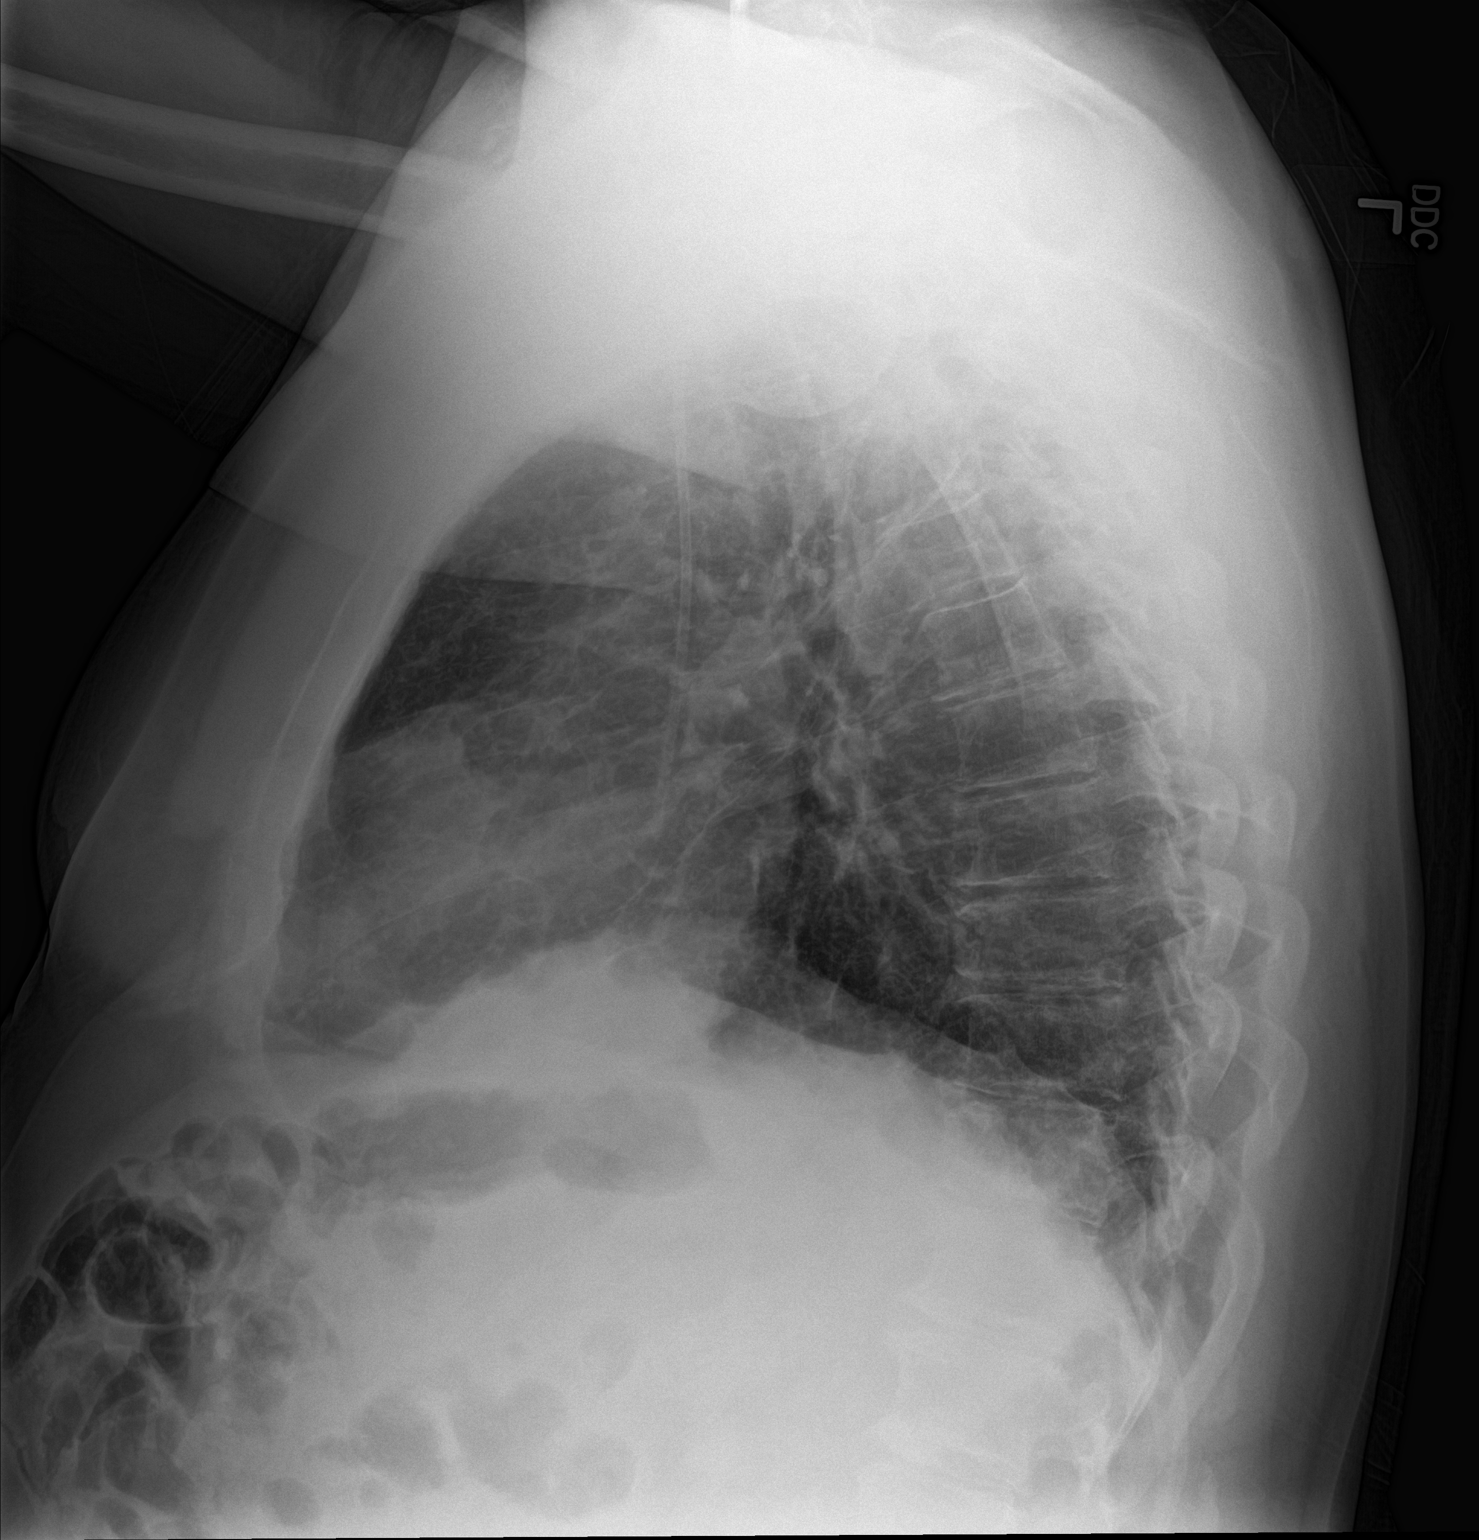

[2 of 2 positions shown; findings below may reference images not displayed]

FINDINGS: Heart size and vascular pattern normal.  Port-A-Cath unchanged.

Mild bilateral diffuse interstitial change is stable from
11/06/2015. Focal scarring and/or atelectasis left base mildly more
prominent. Left greater than right apical opacification is unchanged
from 11/06/2015. 12 mm irregular opacity lateral right upper lobe
appears new from prior study.
IMPRESSION: Overall similar appearance to diffuse interstitial change and apical
opacities left greater than right.

New 12 mm irregular opacity lateral right upper lobe. Mildly
increased fibrosis/atelectasis left base.
